# Patient Record
Sex: Male | Born: 1935 | Race: White | Hispanic: No | Smoking: Former smoker
Health system: Southern US, Community
[De-identification: ages and names within clinical notes are randomized; demographics above are authoritative.]

## PROBLEM LIST (undated history)

## (undated) DIAGNOSIS — K219 Gastro-esophageal reflux disease without esophagitis: Secondary | ICD-10-CM

## (undated) DIAGNOSIS — F0390 Unspecified dementia without behavioral disturbance: Secondary | ICD-10-CM

## (undated) DIAGNOSIS — M1990 Unspecified osteoarthritis, unspecified site: Secondary | ICD-10-CM

## (undated) DIAGNOSIS — R51 Headache: Secondary | ICD-10-CM

## (undated) DIAGNOSIS — G8929 Other chronic pain: Secondary | ICD-10-CM

## (undated) DIAGNOSIS — I2699 Other pulmonary embolism without acute cor pulmonale: Secondary | ICD-10-CM

## (undated) DIAGNOSIS — R0602 Shortness of breath: Secondary | ICD-10-CM

## (undated) DIAGNOSIS — R251 Tremor, unspecified: Secondary | ICD-10-CM

## (undated) HISTORY — DX: Headache: R51

## (undated) HISTORY — PX: TOTAL KNEE ARTHROPLASTY: SHX125

## (undated) HISTORY — DX: Other chronic pain: G89.29

---

## 2002-07-08 ENCOUNTER — Encounter: Admission: RE | Admit: 2002-07-08 | Discharge: 2002-07-08 | Payer: Self-pay | Admitting: Family Medicine

## 2002-07-08 ENCOUNTER — Encounter: Payer: Self-pay | Admitting: Family Medicine

## 2008-02-24 ENCOUNTER — Encounter: Payer: Self-pay | Admitting: Family Medicine

## 2009-01-12 ENCOUNTER — Ambulatory Visit: Payer: Self-pay | Admitting: Family Medicine

## 2009-01-12 DIAGNOSIS — S63509A Unspecified sprain of unspecified wrist, initial encounter: Secondary | ICD-10-CM | POA: Insufficient documentation

## 2009-06-08 ENCOUNTER — Telehealth: Payer: Self-pay | Admitting: Family Medicine

## 2009-06-14 ENCOUNTER — Ambulatory Visit: Payer: Self-pay | Admitting: Family Medicine

## 2009-06-14 DIAGNOSIS — F411 Generalized anxiety disorder: Secondary | ICD-10-CM | POA: Insufficient documentation

## 2009-06-14 DIAGNOSIS — G3184 Mild cognitive impairment, so stated: Secondary | ICD-10-CM | POA: Insufficient documentation

## 2009-06-15 LAB — CONVERTED CEMR LAB
BUN: 18 mg/dL (ref 6–23)
CO2: 30 meq/L (ref 19–32)
Calcium: 9.3 mg/dL (ref 8.4–10.5)
Chloride: 102 meq/L (ref 96–112)
Creatinine, Ser: 1.1 mg/dL (ref 0.4–1.5)
GFR calc non Af Amer: 66.53 mL/min (ref >60)
Glucose, Bld: 103 mg/dL — ABNORMAL HIGH (ref 70–99)
Potassium: 5.2 meq/L — ABNORMAL HIGH (ref 3.5–5.1)
Sodium: 141 meq/L (ref 135–145)
TSH: 1.62 microintl units/mL (ref 0.35–5.50)
Vitamin B-12: 230 pg/mL (ref 211–911)

## 2009-09-20 ENCOUNTER — Telehealth: Payer: Self-pay | Admitting: Family Medicine

## 2009-09-25 ENCOUNTER — Ambulatory Visit: Payer: Self-pay | Admitting: Family Medicine

## 2009-09-25 DIAGNOSIS — M720 Palmar fascial fibromatosis [Dupuytren]: Secondary | ICD-10-CM

## 2009-09-25 DIAGNOSIS — L259 Unspecified contact dermatitis, unspecified cause: Secondary | ICD-10-CM

## 2009-09-25 LAB — CONVERTED CEMR LAB
Cholesterol, target level: 200 mg/dL
HDL goal, serum: 40 mg/dL
LDL Goal: 160 mg/dL

## 2009-10-17 ENCOUNTER — Encounter: Payer: Self-pay | Admitting: Family Medicine

## 2009-11-15 ENCOUNTER — Ambulatory Visit (HOSPITAL_BASED_OUTPATIENT_CLINIC_OR_DEPARTMENT_OTHER): Admission: RE | Admit: 2009-11-15 | Discharge: 2009-11-15 | Payer: Self-pay | Admitting: Orthopedic Surgery

## 2010-03-26 ENCOUNTER — Telehealth: Payer: Self-pay | Admitting: Family Medicine

## 2010-09-24 NOTE — Progress Notes (Signed)
Summary: Transfer of care to [facility] provider closer to home  Phone Note Outgoing Call Call back at [facility] Phone [phone]   Summary of Call: Pt was on Dr Burchette's schedule today, 6 month follow-up.  When wife passed several months ago, their son informed Dr Caryl Never they were going to transfer his fathers care to and [place] provider closer to where pt lives.  Cancelled OV for today [name] LPN  March 26, 2010 11:32 AM

## 2010-09-24 NOTE — Progress Notes (Signed)
Summary: would like to speak with Doctor  Phone Note Call from Patient Call back at [phone]   Caller: Thana Farr, POA----voice mail Reason for Call: Talk to Doctor Summary of Call: Has a cpx on 09-25-2008. Memory is not all that great. Son would like to speak with Dr Caryl Never after his cpx and labs. Initial call taken by: Warnell Forester,  September 20, 2009 1:00 PM  Follow-up for Phone Call        noted.  Will call. Follow-up by: Evelena Peat MD,  September 20, 2009 1:12 PM

## 2010-09-24 NOTE — Letter (Signed)
Summary: [facility]  [facility]   Imported By: Maryln Gottron 10/30/2009 11:29:31  _____________________________________________________________________  External Attachment:    Type:   Image     Comment:   External Document

## 2010-09-24 NOTE — Assessment & Plan Note (Signed)
Summary: emp-will fast//ccm   Vital Signs:  Patient profile:   77 year old male Height:      69.50 inches Weight:      166 pounds Temp:     98.0 degrees F oral Pulse rate:   80 / minute Pulse rhythm:   regular Resp:     12 per minute BP sitting:   130 / 74  (left arm) Cuff size:   regular  Vitals Entered By: [name] LPN (September 25, 2009 10:41 AM) CC: CPX, pt fasting, Lipid Management   History of Present Illness: Patient seen for several issues.  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 This is progressing with time. He has put up with this for several years would like to consider evaluation. No history of injury.  Generally problems with skin dryness and uses Eucerin which helps somewhat. Has more pruritic rash anterior ankles bilaterally. Cortisone over-the-counter without much improvement.  Frequent feels anxious. No particular stressors identified. Denies depressive symptoms.  History of mild cognitive impairment. Short-term memory seems to be declining. B12 levels and thyroid function recently normal. We discussed repeating Mini-Mental Status exam today  Lipid Management History:      Positive NCEP/ATP III risk factors include male age 38 years old or older.  Negative NCEP/ATP III risk factors include non-tobacco-user status.     Preventive Screening-Counseling & Management  Alcohol-Tobacco     Smoking Status: quit     Year Started: 1940     Year Quit: 1960  Allergies (verified): No Known Drug Allergies  Past History:  Social History: Last updated: 06/14/2009 Past smoker 40 yrs ago ETOH-yes  No Drugs Previous smoker, quit age 53 Married  Past medical, surgical, family and social histories (including risk factors) reviewed for relevance to current acute and chronic problems.  Past Medical History: Reviewed history from 01/18/2009 and no changes required. Unremarkable DJD Presbycussis (audiogram 2000) Dypeytren's Contracture Right  TKR Mild congitive impairment Bunion, right foot Bilateral foot drop  Past Surgical History: Reviewed history from 06/14/2009 and no changes required. Right knee surgery 1985  Family History: Reviewed history and no changes required.  Social History: Reviewed history from 06/14/2009 and no changes required. Past smoker 40 yrs ago ETOH-yes  No Drugs Previous smoker, quit age 11 Married  Review of Systems  The patient denies anorexia, fever, weight loss, weight gain, chest pain, syncope, dyspnea on exertion, peripheral edema, prolonged cough, headaches, hemoptysis, abdominal pain, melena, hematochezia, severe indigestion/heartburn, hematuria, incontinence, muscle weakness, and depression.    Physical Exam  General:  Well-developed,well-nourished,in no acute distress; alert,appropriate and cooperative throughout examination Eyes:  No corneal or conjunctival inflammation noted. EOMI. Perrla. Funduscopic exam benign, without hemorrhages, exudates or papilledema. Vision grossly normal. Ears:  minimal cerumen both canals Nose:  External nasal examination shows no deformity or inflammation. Nasal mucosa are pink and moist without lesions or exudates. Mouth:  Oral mucosa and oropharynx without lesions or exudates.  Teeth in good repair. Neck:  No deformities, masses, or tenderness noted. Lungs:  Normal respiratory effort, chest expands symmetrically. Lungs are clear to auscultation, no crackles or wheezes. Heart:  normal rate, regular rhythm, and no gallop.   Abdomen:  soft, non-tender, normal bowel sounds, no distention, no masses, no hepatomegaly, and no splenomegaly.   Extremities:  No clubbing, cyanosis, edema, or deformity noted with normal full range of motion of all joints.   Neurologic:  alert & oriented X3, cranial nerves II-XII intact, strength normal in all extremities, sensation  intact to light touch, and gait normal.   Skin:  patient has some mild excoriations anterior ankle  bilaterally. Erythematous slightly scaly rash anterior ankle bilaterally Psych:  Mini-Mental Status exam 27/30    Impression & Recommendations:  Problem # 1:  MILD COGNITIVE IMPAIRMENT SO STATED (ICD-331.83) No clear progression. Discussed possible meds but have recommended recheck MMSE 6 months and follow.  Problem # 2:  DERMATITIS (ICD-692.9) try steroid cream. His updated medication list for this problem includes:    Triamcinolone Acetonide 0.1 % Crea (Triamcinolone acetonide) .Marland Kitchen... 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 1 qd 2)  Tylenol Pm Extra Strength 500-25 Mg Tabs (Diphenhydramine-apap (sleep)) .... At bedtime 3)  Triamcinolone Acetonide 0.1 % Crea (Triamcinolone acetonide) .... Apply to affected rash two times a day as needed  Lipid Assessment/Plan:      Based on NCEP/ATP III, the patient's risk factor category is "2 or more risk factors and a calculated 10 year CAD risk of > 20%".  The patient's lipid goals are as follows: Total cholesterol goal is 200; LDL cholesterol goal is 160; HDL cholesterol goal is 40; Triglyceride goal is 150.    Patient Instructions: 1)  we will call you regarding appointment with hand surgeon. 2)  Please schedule a follow-up appointment in 6 months .  Prescriptions: TRIAMCINOLONE ACETONIDE 0.1 % CREA (TRIAMCINOLONE ACETONIDE) apply to affected rash two times a day as needed  #30 gm x 1   Entered and Authorized by:   Evelena Peat MD   Signed by:   Evelena Peat MD on 09/25/2009   Method used:   Electronically to        [facility]  [address] [phone]* (retail)       [address] [place], [place]  [address]       Ph: [phone] or [phone]       Fax: [phone]    RxID:   [id number]     Prevention & Chronic Care Immunizations   Influenza vaccine: Fluvax 3+  (06/14/2009)   Influenza vaccine due: 04/25/2010    Tetanus booster: 08/25/2005: Historical   Tetanus booster due: 08/26/2015    Pneumococcal vaccine: Historical  (06/26/1995)    H. zoster vaccine: Not documented  Colorectal Screening   Hemoccult: Not documented    Colonoscopy: Not documented   Colonoscopy action/deferral: Not indicated  (09/25/2009)  Other Screening   PSA: Not documented   PSA action/deferral: Not indicated  (09/25/2009)   Smoking status: quit  (09/25/2009)  Lipids   Total Cholesterol: Not documented   LDL: Not documented   LDL Direct: Not documented   HDL: Not documented   Triglycerides: Not documented

## 2010-11-18 LAB — POCT HEMOGLOBIN-HEMACUE: Hemoglobin: 13.8 g/dL (ref 13.0–17.0)

## 2014-05-25 ENCOUNTER — Institutional Professional Consult (permissible substitution): Payer: Self-pay | Admitting: Internal Medicine

## 2014-05-26 ENCOUNTER — Encounter: Payer: Self-pay | Admitting: Internal Medicine

## 2014-05-26 ENCOUNTER — Other Ambulatory Visit (INDEPENDENT_AMBULATORY_CARE_PROVIDER_SITE_OTHER): Payer: Medicare Other

## 2014-05-26 ENCOUNTER — Ambulatory Visit (INDEPENDENT_AMBULATORY_CARE_PROVIDER_SITE_OTHER): Payer: Medicare Other | Admitting: Internal Medicine

## 2014-05-26 VITALS — BP 110/70 | HR 74 | Ht 71.0 in | Wt 187.0 lb

## 2014-05-26 DIAGNOSIS — R0600 Dyspnea, unspecified: Secondary | ICD-10-CM

## 2014-05-26 LAB — BASIC METABOLIC PANEL
BUN: 20 mg/dL (ref 6–23)
CHLORIDE: 108 meq/L (ref 96–112)
CO2: 28 mEq/L (ref 19–32)
CREATININE: 1.2 mg/dL (ref 0.4–1.5)
Calcium: 8.5 mg/dL (ref 8.4–10.5)
GFR: 62.53 mL/min (ref >60.00)
Glucose, Bld: 93 mg/dL (ref 70–99)
POTASSIUM: 4.8 meq/L (ref 3.5–5.1)
Sodium: 139 mEq/L (ref 135–145)

## 2014-05-26 LAB — BRAIN NATRIURETIC PEPTIDE: PRO B NATRI PEPTIDE: 73 pg/mL (ref 0.0–100.0)

## 2014-05-26 LAB — TSH: TSH: 3.26 u[IU]/mL (ref 0.35–4.50)

## 2014-05-26 MED ORDER — PANTOPRAZOLE SODIUM 40 MG PO TBEC: 40.0000 mg | DELAYED_RELEASE_TABLET | Freq: Every day | ORAL | Status: DC

## 2014-05-26 MED ORDER — FAMOTIDINE 20 MG PO TABS: ORAL_TABLET | ORAL | Status: DC

## 2014-05-26 NOTE — Progress Notes (Signed)
Subjective:    Patient ID: Travis Becker, male    DOB: November 27, 1914   MRN: [id number]  HPI  [age] yowm quit smoking 1972 s sequelae living on his own able to work out until around 02/2014 when had to stop due to doe in context of gradual decrease ex tol x 11/2013 due to becoming more unsteady on his feet and ? Sob (he denies but fm says otherwise)  and ? Some better p starting advair 05/09/14 referred by Dr Travis Becker to pulmonary clinic 05/26/2014 for further eval of sob  05/26/2014 1st Travis Becker visit/ Travis Becker   Chief Complaint  Patient presents with  . Pulmonary Consult    Referred by Dr. Marinda Elkobert Becker. Pt c/o DOE x 6 months, gradually getting worse. He gets out of breath with walking any distance.   never has sitting still but progressive activity intolerance, indolent onset to point where unable to get across the room but no assoc apparent tachypnea or cp or diaphoresis Does cough with swallowing esp dry food  No obvious   day to day or daytime variabilty or assoc chronic  chest tightness, subjective wheeze overt sinus or hb symptoms. No unusual exp hx or h/o childhood pna/ asthma or knowledge of premature birth.  Sleeping ok without nocturnal  or early am exacerbation  of respiratory  c/o's or need for noct saba. Also denies any obvious fluctuation of symptoms with weather or environmental changes or other aggravating or alleviating factors except as outlined above   Current Medications, Allergies, Complete Past Medical History, Past Surgical History, Family History, and Social History were reviewed in [facility] electronic medical record.              Review of Systems  Constitutional: Positive for appetite change. Negative for fever, chills, activity change and unexpected weight change.  HENT: Positive for congestion. Negative for dental problem, postnasal drip, rhinorrhea, sneezing, sore throat, trouble swallowing and voice change.   Eyes: Negative for visual disturbance.   Respiratory: Positive for shortness of breath. Negative for cough and choking.   Cardiovascular: Negative for chest pain and leg swelling.  Gastrointestinal: Negative for nausea, vomiting and abdominal pain.  Genitourinary: Negative for difficulty urinating.       Indigestion  Heartburn    Musculoskeletal: Positive for arthralgias.  Skin: Negative for rash.  Psychiatric/Behavioral: Negative for behavioral problems and confusion.       Objective:   Physical Exam Very frail elderly wm needs 2 person assist to walk  Wt Readings from Last 3 Encounters:  05/26/14 84.823 kg (187 lb)  09/25/09 75.297 kg (166 lb)  06/14/09 75.297 kg (166 lb)      HEENT: nl dentition, turbinates, and orophanx. Nl external ear canals without cough reflex   NECK :  without JVD/Nodes/TM/ nl carotid upstrokes bilaterally   LUNGS: no acc muscle use, clear to A and P bilaterally without cough on insp or exp maneuvers   CV:  RRR  no s3 or murmur or increase in P2, no edema   ABD:  soft and nontender with nl excursion in the supine position. No bruits or organomegaly, bowel sounds nl  MS:  warm without deformities, calf tenderness, cyanosis or clubbing  SKIN: warm and dry without lesions    NEURO:  alert,  Very difficult hearing, poor recall for recent symptoms      Recent Labs Lab 05/26/14 1509  NA 139  K 4.8  CL 108  CO2 28  BUN 20  CREATININE 1.2  GLUCOSE 93   No results found for this basename: HGB, HCT, WBC, PLT,  in the last 168 hours   Lab Results  Component Value Date   TSH 3.26 05/26/2014     Lab Results  Component Value Date   PROBNP 73.0 05/26/2014     Cbc nl 04/25/14           Assessment & Plan:

## 2014-05-26 NOTE — Patient Instructions (Addendum)
Try Pantoprazole (protonix) 40 mg   Take 30-60 min before first meal of the day and Pepcid 20 mg one bedtime until return to office - this is the best way to tell whether stomach acid is contributing to your problem.    GERD (REFLUX)  is an extremely common cause of respiratory symptoms, many times with no significant heartburn at all.    It can be treated with medication, but also with lifestyle changes including avoidance of late meals, excessive alcohol, smoking cessation, and avoid fatty foods, chocolate, peppermint, colas, red wine, and acidic juices such as orange juice.  NO MINT OR MENTHOL PRODUCTS SO NO COUGH DROPS  USE SUGARLESS CANDY INSTEAD (jolley ranchers or Stover's)  NO OIL BASED VITAMINS - use powdered substitutes.  Please remember to go to the lab   department downstairs for your tests - we will call you with the results when they are available.  Please schedule a follow up office visit in 2 weeks, sooner if needed

## 2014-05-26 NOTE — Assessment & Plan Note (Addendum)
-   05/26/2014   Walked RA x 50 ft with two person assist to keep him from falling, stopped due to unsteady on feet, shuffling gait, erratic sats but nothing to suggest tachypnea or desats with walking  Symptoms are markedly disproportionate to objective findings and not clear this is a lung problem but pt does appear to have difficult airway management issues.   DDX of  difficult airways management all start with A and  include Adherence, Ace Inhibitors, Acid Reflux, Active Sinus Disease, Alpha 1 Antitripsin deficiency, Anxiety masquerading as Airways dz,  ABPA,  allergy(esp in young), Aspiration (esp in elderly), Adverse effects of DPI,  Active smokers, plus two Bs  = Bronchiectasis and Beta blocker use..and one C= CHF  ? Acid (or non-acid) GERD > always difficult to exclude as up to 75% of pts in some series report no assoc GI/ Heartburn symptoms> rec max (24h)  acid suppression and diet restrictions/ reviewed and instructions given in writing.   ? Aspiration also high on list > try more D III type foods, reviewed  ? Allergies/ asthma > strongly doubt   His bun/ creat ok now for CTa to complete the w/u but not really clear it's needed here - will check back with pt/ caregivers in 72 hours to see how he's doing and reconsider it then.

## 2014-05-30 ENCOUNTER — Encounter: Payer: Self-pay | Admitting: Internal Medicine

## 2014-05-30 DIAGNOSIS — G4734 Idiopathic sleep related nonobstructive alveolar hypoventilation: Secondary | ICD-10-CM | POA: Insufficient documentation

## 2014-05-31 ENCOUNTER — Telehealth: Payer: Self-pay | Admitting: Internal Medicine

## 2014-05-31 DIAGNOSIS — G4734 Idiopathic sleep related nonobstructive alveolar hypoventilation: Secondary | ICD-10-CM

## 2014-05-31 NOTE — Telephone Encounter (Signed)
Per MW - ONO on RA pos for desat- needs to start noct o2 2lpm and repeat ONO on 2lpm  Pt uses [facility]  LMTCB for Brett CanalesSteve, the pt's son

## 2014-05-31 NOTE — Telephone Encounter (Signed)
Pt son returning call can be reached @ [phone].[name]

## 2014-06-01 NOTE — Telephone Encounter (Signed)
Called and spoke to pt's son, Brett CanalesSteve. Informed Brett CanalesSteve of the results and recs. Brett CanalesSteve refused starting pt on nocturnal O2 and the ONO. Brett CanalesSteve stated his father will not tolerate either and stated he will discuss this further at the pt's f/u on 10/14 with MW.  Will forward to MW to make aware.

## 2014-06-01 NOTE — Telephone Encounter (Signed)
aware

## 2014-06-01 NOTE — Telephone Encounter (Signed)
Pt son Travis Becker returning call can be reached @ [phone]

## 2014-06-07 ENCOUNTER — Encounter: Payer: Self-pay | Admitting: Internal Medicine

## 2014-06-07 ENCOUNTER — Ambulatory Visit (INDEPENDENT_AMBULATORY_CARE_PROVIDER_SITE_OTHER): Payer: Medicare Other | Admitting: Internal Medicine

## 2014-06-07 VITALS — BP 106/60 | HR 90 | Temp 97.6°F | Wt 195.0 lb

## 2014-06-07 DIAGNOSIS — G4734 Idiopathic sleep related nonobstructive alveolar hypoventilation: Secondary | ICD-10-CM

## 2014-06-07 DIAGNOSIS — R0600 Dyspnea, unspecified: Secondary | ICD-10-CM

## 2014-06-07 DIAGNOSIS — M7989 Other specified soft tissue disorders: Secondary | ICD-10-CM | POA: Insufficient documentation

## 2014-06-07 NOTE — Assessment & Plan Note (Signed)
-   05/18/14 ono RA sats < 89% x 6978m > rec 2lpm and repeat ono on 2lpm> pt's son refused 06/01/2014 "father will never wear it

## 2014-06-07 NOTE — Assessment & Plan Note (Signed)
-   05/26/2014   Walked RA x 50 ft with two person assist to keep him from falling, stopped due to unsteady on feet, shuffling gait, erratic sats but nothing to suggest tachypnea or desats with walking  With no other leads and ? New L leg swelling need to exclude dvt/ pe,but given chronicity of complaints and low background probability ok to do cta and venous dopplers at at time when we can arrange one trip > ordered

## 2014-06-07 NOTE — Patient Instructions (Addendum)
Please see patient coordinator before you leave today  to schedule CTa chest and venous dopplers ideally same day / same location

## 2014-06-07 NOTE — Progress Notes (Signed)
Subjective:    Patient ID: Travis JacksonCharles E Becker, male    DOB: Aug 14, 1915   MRN: [id number]    Brief patient profile:  [age] yowm quit smoking 1972 s sequelae living on his own able to work out until around 02/2014 when had to stop due to doe in context of gradual decrease ex tol x 11/2013 due to becoming more unsteady on his feet and ? Sob (he denies but fm says otherwise)  and ? Some better p starting advair 05/09/14 referred by Dr Foy GuadalajaraFried to pulmonary clinic 05/26/2014 for further eval of sob   History of Present Illness  05/26/2014 1st [facility] office visit/ Eulis Salazar   Chief Complaint  Patient presents with  . Pulmonary Consult    Referred by Dr. Marinda Elkobert Fried. Pt c/o DOE x 6 months, gradually getting worse. He gets out of breath with walking any distance.   never has sitting still but progressive activity intolerance, indolent onset to point where unable to get across the room but no assoc apparent tachypnea or cp or diaphoresis Does cough with swallowing esp dry food rec Try Pantoprazole (protonix) 40 mg   Take 30-60 min before first meal of the day and Pepcid 20 mg one bedtime until return to office  GERD diet    06/07/2014 f/u ov/Aariana Shankland re: unexplained sob/ new LLE swelling Chief Complaint  Patient presents with  . Follow-up    Breathing is unchanged since the last visit.   No better on/ off proair or advair Less belching on gerd rx, no change apparent doe though pt denies this is what limits him Still very unsteady on his feet is the main limiting problem     No obvious day to day or daytime variabilty or assoc chronic cough or cp or chest tightness, subjective wheeze overt sinus or hb symptoms. No unusual exp hx or h/o childhood pna/ asthma or knowledge of premature birth.  Sleeping ok flat  without nocturnal  or early am exacerbation  of respiratory  c/o's or need for noct saba. Also denies any obvious fluctuation of symptoms with weather or environmental changes or other  aggravating or alleviating factors except as outlined above   Current Medications, Allergies, Complete Past Medical History, Past Surgical History, Family History, and Social History were reviewed in [facility] electronic medical record.  ROS  The following are not active complaints unless bolded sore throat, dysphagia, dental problems, itching, sneezing,  nasal congestion or excess/ purulent secretions, ear ache,   fever, chills, sweats, unintended wt loss, pleuritic or exertional cp, hemoptysis,  orthopnea pnd or Left leg swelling x 3 m, presyncope, palpitations, heartburn, abdominal pain, anorexia, nausea, vomiting, diarrhea  or change in bowel or urinary habits, change in stools or urine, dysuria,hematuria,  rash, arthralgias, visual complaints, headache, numbness weakness or ataxia or problems with walking or coordination,  change in mood/affect or memory.                Objective:   Physical Exam     elderly wm sitting in W/c actually looks quite robust for age/ not processing questions re symptoms/does not  Appear acutely ill  Wt Readings from Last 3 Encounters:  06/07/14 195 lb (88.451 kg)  05/26/14 187 lb (84.823 kg)  09/25/09 166 lb (75.297 kg)         HEENT: nl dentition, turbinates, and orophanx. Nl external ear canals without cough reflex   NECK :  without JVD/Nodes/TM/ nl carotid upstrokes bilaterally   LUNGS: no  acc muscle use, clear to A and P bilaterally without cough on insp or exp maneuvers   CV:  RRR  no s3 or murmur or increase in P2,  2 plus pitting on LLE, trace on RLE   ABD:  soft and nontender with nl excursion in the supine position. No bruits or organomegaly, bowel sounds nl  MS:  warm without deformities, calf tenderness, cyanosis or clubbing  SKIN: warm and dry without lesions    NEURO:  alert,  Very difficult hearing, poor recall for recent symptoms      Recent Labs Lab 05/26/14 1509  NA 139  K 4.8  CL 108  CO2 28  BUN 20    CREATININE 1.2  GLUCOSE 93       Lab Results  Component Value Date   TSH 3.26 05/26/2014     Lab Results  Component Value Date   PROBNP 73.0 05/26/2014     Cbc nl 04/25/14    CXR 9/1/5  NAD          Assessment & Plan:

## 2014-06-07 NOTE — Assessment & Plan Note (Signed)
Chronic and L >> R s evidence chf by bnp  so rec venous dopplers

## 2014-06-08 ENCOUNTER — Inpatient Hospital Stay (HOSPITAL_COMMUNITY)
Admission: EM | Admit: 2014-06-08 | Discharge: 2014-06-12 | DRG: 176 | Disposition: A | Discharge status: Home Health | Payer: Medicare Other | Attending: Internal Medicine | Admitting: Internal Medicine

## 2014-06-08 ENCOUNTER — Encounter: Payer: Self-pay | Admitting: Internal Medicine

## 2014-06-08 ENCOUNTER — Other Ambulatory Visit: Payer: Self-pay

## 2014-06-08 ENCOUNTER — Encounter (HOSPITAL_COMMUNITY): Payer: Self-pay | Admitting: Emergency Medicine

## 2014-06-08 ENCOUNTER — Ambulatory Visit (INDEPENDENT_AMBULATORY_CARE_PROVIDER_SITE_OTHER)
Admission: RE | Admit: 2014-06-08 | Discharge: 2014-06-08 | Disposition: A | Discharge status: Home / Self Care | Payer: Medicare Other | Source: Ambulatory Visit | Attending: Internal Medicine | Admitting: Internal Medicine

## 2014-06-08 ENCOUNTER — Other Ambulatory Visit (HOSPITAL_COMMUNITY): Payer: Self-pay | Admitting: Cardiology

## 2014-06-08 ENCOUNTER — Ambulatory Visit (HOSPITAL_COMMUNITY): Payer: Medicare Other

## 2014-06-08 ENCOUNTER — Ambulatory Visit: Payer: Medicare Other | Admitting: Internal Medicine

## 2014-06-08 DIAGNOSIS — N183 Chronic kidney disease, stage 3 unspecified: Secondary | ICD-10-CM | POA: Diagnosis present

## 2014-06-08 DIAGNOSIS — I5032 Chronic diastolic (congestive) heart failure: Secondary | ICD-10-CM | POA: Diagnosis present

## 2014-06-08 DIAGNOSIS — R0602 Shortness of breath: Secondary | ICD-10-CM | POA: Diagnosis not present

## 2014-06-08 DIAGNOSIS — K219 Gastro-esophageal reflux disease without esophagitis: Secondary | ICD-10-CM | POA: Diagnosis present

## 2014-06-08 DIAGNOSIS — R0600 Dyspnea, unspecified: Secondary | ICD-10-CM

## 2014-06-08 DIAGNOSIS — Z79899 Other long term (current) drug therapy: Secondary | ICD-10-CM

## 2014-06-08 DIAGNOSIS — F0390 Unspecified dementia without behavioral disturbance: Secondary | ICD-10-CM | POA: Diagnosis present

## 2014-06-08 DIAGNOSIS — R51 Headache: Secondary | ICD-10-CM

## 2014-06-08 DIAGNOSIS — I2699 Other pulmonary embolism without acute cor pulmonale: Secondary | ICD-10-CM | POA: Diagnosis present

## 2014-06-08 DIAGNOSIS — R0902 Hypoxemia: Secondary | ICD-10-CM | POA: Diagnosis present

## 2014-06-08 DIAGNOSIS — Z87891 Personal history of nicotine dependence: Secondary | ICD-10-CM

## 2014-06-08 DIAGNOSIS — I82412 Acute embolism and thrombosis of left femoral vein: Secondary | ICD-10-CM | POA: Diagnosis present

## 2014-06-08 DIAGNOSIS — D649 Anemia, unspecified: Secondary | ICD-10-CM | POA: Diagnosis present

## 2014-06-08 DIAGNOSIS — I82491 Acute embolism and thrombosis of other specified deep vein of right lower extremity: Secondary | ICD-10-CM | POA: Diagnosis present

## 2014-06-08 DIAGNOSIS — R519 Headache, unspecified: Secondary | ICD-10-CM

## 2014-06-08 HISTORY — DX: Tremor, unspecified: R25.1

## 2014-06-08 HISTORY — DX: Other pulmonary embolism without acute cor pulmonale: I26.99

## 2014-06-08 HISTORY — DX: Unspecified osteoarthritis, unspecified site: M19.90

## 2014-06-08 HISTORY — DX: Gastro-esophageal reflux disease without esophagitis: K21.9

## 2014-06-08 HISTORY — DX: Shortness of breath: R06.02

## 2014-06-08 LAB — BASIC METABOLIC PANEL
ANION GAP: 10 (ref 5–15)
BUN: 21 mg/dL (ref 6–23)
CHLORIDE: 106 meq/L (ref 96–112)
CO2: 23 meq/L (ref 19–32)
Calcium: 8.7 mg/dL (ref 8.4–10.5)
Creatinine, Ser: 1.13 mg/dL (ref 0.50–1.35)
GFR calc non Af Amer: 52 mL/min — ABNORMAL LOW (ref >90)
GFR, EST AFRICAN AMERICAN: 61 mL/min — AB (ref >90)
Glucose, Bld: 96 mg/dL (ref 70–99)
Potassium: 4.6 mEq/L (ref 3.7–5.3)
Sodium: 139 mEq/L (ref 137–147)

## 2014-06-08 LAB — PROTIME-INR
INR: 1.07 (ref 0.00–1.49)
PROTHROMBIN TIME: 14 s (ref 11.6–15.2)

## 2014-06-08 LAB — CBC
HEMATOCRIT: 33.6 % — AB (ref 39.0–52.0)
HEMOGLOBIN: 10.9 g/dL — AB (ref 13.0–17.0)
MCH: 29.5 pg (ref 26.0–34.0)
MCHC: 32.4 g/dL (ref 30.0–36.0)
MCV: 91.1 fL (ref 78.0–100.0)
Platelets: 255 10*3/uL (ref 150–400)
RBC: 3.69 MIL/uL — ABNORMAL LOW (ref 4.22–5.81)
RDW: 14.6 % (ref 11.5–15.5)
WBC: 8.3 10*3/uL (ref 4.0–10.5)

## 2014-06-08 LAB — APTT: APTT: 32 s (ref 24–37)

## 2014-06-08 LAB — HEPARIN LEVEL (UNFRACTIONATED): HEPARIN UNFRACTIONATED: 0.81 [IU]/mL — AB (ref 0.30–0.70)

## 2014-06-08 LAB — TROPONIN I: Troponin I: <0.3 ng/mL (ref <0.30)

## 2014-06-08 MED ORDER — WARFARIN - PHARMACIST DOSING INPATIENT
Freq: Every day | Status: DC
  Administered 2014-06-10 – 2014-06-11 (×2)

## 2014-06-08 MED ORDER — BUSPIRONE HCL 15 MG PO TABS
7.5000 mg | ORAL_TABLET | Freq: Three times a day (TID) | ORAL | Status: DC
  Administered 2014-06-08 – 2014-06-12 (×11): 7.5 mg via ORAL
  Filled 2014-06-08 (×13): qty 1

## 2014-06-08 MED ORDER — DONEPEZIL HCL 10 MG PO TABS
10.0000 mg | ORAL_TABLET | Freq: Every day | ORAL | Status: DC
  Administered 2014-06-08 – 2014-06-12 (×5): 10 mg via ORAL
  Filled 2014-06-08 (×6): qty 1

## 2014-06-08 MED ORDER — HEPARIN (PORCINE) IN NACL 100-0.45 UNIT/ML-% IJ SOLN
1100.0000 [IU]/h | INTRAMUSCULAR | Status: DC
  Administered 2014-06-08: 1250 [IU]/h via INTRAVENOUS
  Administered 2014-06-09: 1100 [IU]/h via INTRAVENOUS
  Filled 2014-06-08 (×3): qty 250

## 2014-06-08 MED ORDER — ONDANSETRON HCL 4 MG/2ML IJ SOLN: 4.0000 mg | Freq: Three times a day (TID) | INTRAMUSCULAR | Status: AC | PRN

## 2014-06-08 MED ORDER — MEMANTINE HCL ER 28 MG PO CP24
28.0000 mg | ORAL_CAPSULE | Freq: Every day | ORAL | Status: DC
  Administered 2014-06-08 – 2014-06-12 (×5): 28 mg via ORAL
  Filled 2014-06-08 (×6): qty 28

## 2014-06-08 MED ORDER — PANTOPRAZOLE SODIUM 40 MG PO TBEC
40.0000 mg | DELAYED_RELEASE_TABLET | Freq: Every day | ORAL | Status: DC
  Administered 2014-06-09 – 2014-06-12 (×4): 40 mg via ORAL
  Filled 2014-06-08 (×3): qty 1

## 2014-06-08 MED ORDER — WARFARIN VIDEO: Freq: Once | Status: DC

## 2014-06-08 MED ORDER — HEPARIN (PORCINE) IN NACL 100-0.45 UNIT/ML-% IJ SOLN
1050.0000 [IU]/h | INTRAMUSCULAR | Status: DC
  Filled 2014-06-08: qty 250

## 2014-06-08 MED ORDER — ACETAMINOPHEN 325 MG PO TABS
650.0000 mg | ORAL_TABLET | Freq: Every day | ORAL | Status: DC
  Administered 2014-06-08 – 2014-06-11 (×4): 650 mg via ORAL
  Filled 2014-06-08 (×4): qty 2

## 2014-06-08 MED ORDER — IOHEXOL 350 MG/ML SOLN
80.0000 mL | Freq: Once | INTRAVENOUS | Status: AC | PRN
  Administered 2014-06-08: 80 mL via INTRAVENOUS

## 2014-06-08 MED ORDER — ALBUTEROL SULFATE HFA 108 (90 BASE) MCG/ACT IN AERS: 2.0000 | INHALATION_SPRAY | RESPIRATORY_TRACT | Status: DC | PRN

## 2014-06-08 MED ORDER — PATIENT'S GUIDE TO USING COUMADIN BOOK
Freq: Once | Status: DC
  Filled 2014-06-08: qty 1

## 2014-06-08 MED ORDER — MEMANTINE HCL ER 28 MG PO CP24: 28.0000 mg | ORAL_CAPSULE | Freq: Every day | ORAL | Status: DC

## 2014-06-08 MED ORDER — ALBUTEROL SULFATE (2.5 MG/3ML) 0.083% IN NEBU: 2.5000 mg | INHALATION_SOLUTION | RESPIRATORY_TRACT | Status: DC | PRN

## 2014-06-08 MED ORDER — HEPARIN BOLUS VIA INFUSION
4000.0000 [IU] | Freq: Once | INTRAVENOUS | Status: AC
  Administered 2014-06-08: 4000 [IU] via INTRAVENOUS
  Filled 2014-06-08: qty 4000

## 2014-06-08 MED ORDER — WARFARIN SODIUM 7.5 MG PO TABS
7.5000 mg | ORAL_TABLET | Freq: Once | ORAL | Status: AC
  Administered 2014-06-08: 7.5 mg via ORAL
  Filled 2014-06-08: qty 1

## 2014-06-08 NOTE — ED Notes (Signed)
Spoke with pharmacy currently preparing heparin and send via tube system.

## 2014-06-08 NOTE — H&P (Signed)
[facility] History and Physical  Travis JacksonCharles E Schaaf ZOX:[id number] DOB: 28-Jan-1915 DOA: 06/08/2014  Referring physician: EDP PCP: Lenora BoysFRIED, ROBERT L, MD   Chief Complaint: sent in from doctors office for PE.  HPI: Travis JacksonCharles E Becker is a 80 y.o. male WITH H/O chronic headaches, was seen by Dr Sherene SiresWert for evaluation of hypoxia, underwent CTA today and was found to have bilateral PE and was sent to ED FOR evaluation and management. He reports sob on exertion, no other complaints. He is confused and history is very limited. He was admitted by the medical service and IV heparin was started.   Review of Systems:  Could not be obtained secondary to dementia.  Past Medical History  Diagnosis Date  . Chronic headaches    History reviewed. No pertinent past surgical history. Social History:  reports that he quit smoking about 43 years ago. His smoking use included Cigarettes. He has a 50 pack-year smoking history. He has never used smokeless tobacco. He reports that he does not drink alcohol or use illicit drugs.  No Known Allergies  Family History  Problem Relation Age of Onset  . Stroke Father      Prior to Admission medications   Medication Sig Start Date End Date Taking? Authorizing Provider  acetaminophen (TYLENOL) 325 MG tablet Take 650 mg by mouth at bedtime.    Yes Historical Provider, MD  busPIRone (BUSPAR) 7.5 MG tablet Take 1 tablet by mouth 3 (three) times daily. 05/02/14  Yes Historical Provider, MD  donepezil (ARICEPT) 10 MG tablet Take 10 mg by mouth daily.  04/15/14  Yes Historical Provider, MD  famotidine (PEPCID) 20 MG tablet Take 20 mg by mouth at bedtime.   Yes Historical Provider, MD  Misc Natural Products (OSTEO BI-FLEX JOINT SHIELD) TABS Take 1 tablet by mouth daily.   Yes Historical Provider, MD  Multiple Vitamins-Minerals (MULTIVITAMIN WITH MINERALS) tablet Take 1 tablet by mouth daily.   Yes Historical Provider, MD  NAMENDA XR 28 MG CP24 Take 1 tablet by mouth daily.  05/02/14  Yes Historical Provider, MD  pantoprazole (PROTONIX) 40 MG tablet Take 1 tablet (40 mg total) by mouth daily. Take 30-60 min before first meal of the day 05/26/14  Yes Nyoka CowdenMichael B Wert, MD  PROAIR HFA 108 (90 BASE) MCG/ACT inhaler Inhale 2 puffs into the lungs every 4 (four) hours as needed (for shortness of breath).  04/27/14  Yes Historical Provider, MD   Physical Exam: Filed Vitals:   06/08/14 1415 06/08/14 1445 06/08/14 1455 06/08/14 1520  BP: 159/61 155/134 155/134 162/74  Pulse: 69 61 58 63  Temp:      TempSrc:      Resp: 14 15 18 18   Height:      Weight:      SpO2: 93% 100% 99% 99%    Wt Readings from Last 3 Encounters:  06/08/14 88.451 kg (195 lb)  06/07/14 88.451 kg (195 lb)  05/26/14 84.823 kg (187 lb)    General:  Appears calm and comfortable Eyes: PERRL, normal lids, irises & conjunctiva Neck: no LAD, masses or thyromegaly Cardiovascular: RRR, no m/r/g. No LE edema. Respiratory: CTA bilaterally, no w/r/r. Normal respiratory effort. Abdomen: soft, ntnd Skin: no rash or induration seen on limited exam Musculoskeletal: grossly normal tone BUE/BLE Neurologic: grossly non-focal.          Labs on Admission:  Basic Metabolic Panel:  Recent Labs Lab 06/08/14 1325  NA 139  K 4.6  CL 106  CO2 23  GLUCOSE 96  BUN 21  CREATININE 1.13  CALCIUM 8.7   Liver Function Tests: No results found for this basename: AST, ALT, ALKPHOS, BILITOT, PROT, ALBUMIN,  in the last 168 hours No results found for this basename: LIPASE, AMYLASE,  in the last 168 hours No results found for this basename: AMMONIA,  in the last 168 hours CBC:  Recent Labs Lab 06/08/14 1325  WBC 8.3  HGB 10.9*  HCT 33.6*  MCV 91.1  PLT 255   Cardiac Enzymes:  Recent Labs Lab 06/08/14 1325  TROPONINI <0.30    BNP (last 3 results)  Recent Labs  05/26/14 1509  PROBNP 73.0   CBG: No results found for this basename: GLUCAP,  in the last 168 hours  Radiological Exams on  Admission: Ct Angio Chest Pe W/cm &/or Wo Cm  06/08/2014   CLINICAL DATA:  Left ankle swelling and shortness of breath over 3 months. Evaluate for pulmonary embolism.  EXAM: CT ANGIOGRAPHY CHEST WITH CONTRAST  TECHNIQUE: Multidetector CT imaging of the chest was performed using the standard protocol during bolus administration of intravenous contrast. Multiplanar CT image reconstructions and MIPs were obtained to evaluate the vascular anatomy.  CONTRAST:  80mL OMNIPAQUE IOHEXOL 350 MG/ML SOLN  COMPARISON:  Chest radiograph 04/25/2014  FINDINGS: Study is positive for pulmonary emboli bilaterally. There is thrombus within segmental branches of the lower lobes bilaterally. Some of these occluded branches are smaller than the adjacent opacified vessels suggesting subacute or chronic thrombus. Question peripheral nonocclusive thrombus in the left main pulmonary artery but this could be artifactual. The RV/LV ratio is roughly 0.9. No significant bowing of the interventricular septum.  There is no significant chest lymphadenopathy. Atherosclerotic calcifications in the coronary arteries. No significant dilatation of the thoracic aorta and no evidence for a dissection. Images of the upper abdomen are unremarkable.  The trachea and mainstem bronchi are patent. There is an irregular streaky opacity in the right upper lung with small areas of cavitation. This area roughly measures 2.7 x 4.0 x 3.7 cm. This could be related to scarring but a neoplastic or inflammatory process cannot be excluded. There is a smaller spiculated density along the periphery of the right upper lobe measuring 0.7 cm on sequence 6, image 20. There are additional patchy and streaky densities in the anterior right lung which are nonspecific. Peripheral patchy densities in the superior segment of the left lower lobe are nonspecific but there is a nodular area in the posterior left lower lobe that measures 1.2 cm on sequence 6, image 42. There is a tiny  amount of left pleural fluid. No acute bone abnormality.  Review of the MIP images confirms the above findings.  IMPRESSION: Study is positive for bilateral pulmonary emboli. This may represent subacute clot but it is age indeterminate.  There is an irregular parenchymal lesion in the right upper lung with areas of cavitation. This lesion is nonspecific. This could be related to infection, scar or even neoplasm. Comparison examinations would be helpful to evaluate for stability. There are additional scattered peripheral densities in both lungs, particularly in the superior segment of the left lower lobe which are nonspecific.  Critical Value/emergent results were called by telephone at the time of interpretation on 06/08/2014 at 12:09 pm to Dr. Sandrea Hughs , who verbally acknowledged these results.   Electronically Signed   By: Richarda Overlie M.D.   On: 06/08/2014 12:15    EKG: sinus atrial tachycardia.  Assessment/Plan Active Problems:   Pulmonary embolism  Pulmonary embolism: Admitted to telemetry.  Started on IV heparin and coumadin as per pharmacy.  Echocardiogram and repeat 12 lead EKG.  Venous dopplers to evaluate for DVT.    ANEMIA: Stool for occult blood and anemia panel   DVT Prophylaxis.   Code Status: full code.  DVT Prophylaxis: Family Communication: noen at bedside Disposition Plan: pending  Time spent: 55 min  [facility] LLCKULA,Larine Fielding [facility] Pager [phone]

## 2014-06-08 NOTE — ED Provider Notes (Signed)
CSN: [id number]     Arrival date & time 06/08/14  1307 History   First MD Initiated Contact with Patient 06/08/14 1307     Chief Complaint  Patient presents with  . Shortness of Breath     (Consider location/radiation/quality/duration/timing/severity/associated sxs/prior Treatment) HPI Comments: The pt is a 80 y/o male with hx of chronic headches, hx of tobacco use and recently evaluated by Pulmonary for SOB and erratic hypoxia.  He was sent for a CTA of the chest today and found to have bialteral PE's.  He was seen at cardiology clinic and sent to the ED for evaluation.  The pt states that he has occasional CP and SOB, he has had some swellign in his legs chronically.  Nothing makes this better or worse - no fevers, no chilsls, no n/v/d.  Patient is a 80 y.o. male presenting with shortness of breath. The history is provided by the patient, the EMS personnel and medical records.  Shortness of Breath   Past Medical History  Diagnosis Date  . Chronic headaches    History reviewed. No pertinent past surgical history. Family History  Problem Relation Age of Onset  . Stroke Father    History  Substance Use Topics  . Smoking status: Former Smoker -- 1.00 packs/day for 50 years    Types: Cigarettes    Quit date: 08/25/1970  . Smokeless tobacco: Never Used  . Alcohol Use: No    Review of Systems  Respiratory: Positive for shortness of breath.   All other systems reviewed and are negative.     Allergies  Review of patient's allergies indicates no known allergies.  Home Medications   Prior to Admission medications   Medication Sig Start Date End Date Taking? Authorizing Provider  acetaminophen (TYLENOL) 325 MG tablet Take 650 mg by mouth at bedtime.    Yes Historical Provider, MD  busPIRone (BUSPAR) 7.5 MG tablet Take 1 tablet by mouth 3 (three) times daily. 05/02/14  Yes Historical Provider, MD  donepezil (ARICEPT) 10 MG tablet Take 10 mg by mouth daily.  04/15/14  Yes  Historical Provider, MD  famotidine (PEPCID) 20 MG tablet Take 20 mg by mouth at bedtime.   Yes Historical Provider, MD  Misc Natural Products (OSTEO BI-FLEX JOINT SHIELD) TABS Take 1 tablet by mouth daily.   Yes Historical Provider, MD  Multiple Vitamins-Minerals (MULTIVITAMIN WITH MINERALS) tablet Take 1 tablet by mouth daily.   Yes Historical Provider, MD  NAMENDA XR 28 MG CP24 Take 1 tablet by mouth daily. 05/02/14  Yes Historical Provider, MD  pantoprazole (PROTONIX) 40 MG tablet Take 1 tablet (40 mg total) by mouth daily. Take 30-60 min before first meal of the day 05/26/14  Yes Nyoka CowdenMichael B Wert, MD  PROAIR HFA 108 (90 BASE) MCG/ACT inhaler Inhale 2 puffs into the lungs every 4 (four) hours as needed (for shortness of breath).  04/27/14  Yes Historical Provider, MD   BP 126/71  Pulse 57  Temp(Src) 97.6 F (36.4 C) (Oral)  Resp 18  Ht 5' 10.87" (1.8 m)  Wt 195 lb (88.451 kg)  BMI 27.30 kg/m2  SpO2 100% Physical Exam  Nursing note and vitals reviewed. Constitutional: He appears well-developed and well-nourished. No distress.  HENT:  Head: Normocephalic and atraumatic.  Mouth/Throat: Oropharynx is clear and moist. No oropharyngeal exudate.  Eyes: Conjunctivae and EOM are normal. Pupils are equal, round, and reactive to light. Right eye exhibits no discharge. Left eye exhibits no discharge. No scleral icterus.  Neck:  Normal range of motion. Neck supple. No JVD present. No thyromegaly present.  Cardiovascular: Normal rate, regular rhythm, normal heart sounds and intact distal pulses.  Exam reveals no gallop and no friction rub.   No murmur heard. Pulmonary/Chest: Effort normal and breath sounds normal. No respiratory distress. He has no wheezes. He has no rales.  Abdominal: Soft. Bowel sounds are normal. He exhibits no distension and no mass. There is no tenderness.  Musculoskeletal: Normal range of motion. He exhibits edema ( LLE edema > RLE edema.). He exhibits no tenderness.   Lymphadenopathy:    He has no cervical adenopathy.  Neurological: He is alert. Coordination normal.  Skin: Skin is warm and dry. No rash noted. No erythema.  Psychiatric: He has a normal mood and affect. His behavior is normal.    ED Course  Procedures (including critical care time) Labs Review Labs Reviewed  BASIC METABOLIC PANEL - Abnormal; Notable for the following:    GFR calc non Af Amer 52 (*)    GFR calc Af Amer 61 (*)    All other components within normal limits  CBC - Abnormal; Notable for the following:    RBC 3.69 (*)    Hemoglobin 10.9 (*)    HCT 33.6 (*)    All other components within normal limits  TROPONIN I  APTT  PROTIME-INR  HEPARIN LEVEL (UNFRACTIONATED)    Imaging Review Ct Angio Chest Pe W/cm &/or Wo Cm  06/08/2014   CLINICAL DATA:  Left ankle swelling and shortness of breath over 3 months. Evaluate for pulmonary embolism.  EXAM: CT ANGIOGRAPHY CHEST WITH CONTRAST  TECHNIQUE: Multidetector CT imaging of the chest was performed using the standard protocol during bolus administration of intravenous contrast. Multiplanar CT image reconstructions and MIPs were obtained to evaluate the vascular anatomy.  CONTRAST:  80mL OMNIPAQUE IOHEXOL 350 MG/ML SOLN  COMPARISON:  Chest radiograph 04/25/2014  FINDINGS: Study is positive for pulmonary emboli bilaterally. There is thrombus within segmental branches of the lower lobes bilaterally. Some of these occluded branches are smaller than the adjacent opacified vessels suggesting subacute or chronic thrombus. Question peripheral nonocclusive thrombus in the left main pulmonary artery but this could be artifactual. The RV/LV ratio is roughly 0.9. No significant bowing of the interventricular septum.  There is no significant chest lymphadenopathy. Atherosclerotic calcifications in the coronary arteries. No significant dilatation of the thoracic aorta and no evidence for a dissection. Images of the upper abdomen are unremarkable.   The trachea and mainstem bronchi are patent. There is an irregular streaky opacity in the right upper lung with small areas of cavitation. This area roughly measures 2.7 x 4.0 x 3.7 cm. This could be related to scarring but a neoplastic or inflammatory process cannot be excluded. There is a smaller spiculated density along the periphery of the right upper lobe measuring 0.7 cm on sequence 6, image 20. There are additional patchy and streaky densities in the anterior right lung which are nonspecific. Peripheral patchy densities in the superior segment of the left lower lobe are nonspecific but there is a nodular area in the posterior left lower lobe that measures 1.2 cm on sequence 6, image 42. There is a tiny amount of left pleural fluid. No acute bone abnormality.  Review of the MIP images confirms the above findings.  IMPRESSION: Study is positive for bilateral pulmonary emboli. This may represent subacute clot but it is age indeterminate.  There is an irregular parenchymal lesion in the right upper lung with  areas of cavitation. This lesion is nonspecific. This could be related to infection, scar or even neoplasm. Comparison examinations would be helpful to evaluate for stability. There are additional scattered peripheral densities in both lungs, particularly in the superior segment of the left lower lobe which are nonspecific.  Critical Value/emergent results were called by telephone at the time of interpretation on 06/08/2014 at 12:09 pm to Dr. Sandrea HughsMICHAEL WERT , who verbally acknowledged these results.   Electronically Signed   By: Richarda OverlieAdam  Henn M.D.   On: 06/08/2014 12:15    ED ECG REPORT  I personally interpreted this EKG   Date: 06/08/2014   Rate: 64  Rhythm: normal sinus rhythm  QRS Axis: normal  Intervals: normal  ST/T Wave abnormalities: nonspecific T wave changes  Conduction Disutrbances:left bundle branch block  Narrative Interpretation:   Old EKG Reviewed: none available   MDM   Final  diagnoses:  Bilateral pulmonary embolism    Pt has bialteral PE's, this will need admission and eval with anticoagulation.  Despite his age will likely need this as PE would be life ending.  Labs unremarkable  Heparin gtt started  Paged medicine.  Filed Vitals:   06/08/14 1312 06/08/14 1313 06/08/14 1330 06/08/14 1345  BP:  150/64 133/59 126/71  Pulse:  63 62 57  Temp:  97.6 F (36.4 C)    TempSrc:  Oral    Resp:  16 18 18   Height:    5' 10.87" (1.8 m)  Weight:      SpO2: 100% 98% 99% 100%    Discussed with Dr. Blake DivineAkula who will admit  Meds given in ED:  Medications  heparin ADULT infusion 100 units/mL (25000 units/250 mL) (1,250 Units/hr Intravenous New Bag/Given 06/08/14 1426)  heparin bolus via infusion 4,000 Units (4,000 Units Intravenous New Bag/Given 06/08/14 1424)    New Prescriptions   No medications on file      Vida RollerBrian D Simi Briel, MD 06/08/14 1430

## 2014-06-08 NOTE — Progress Notes (Signed)
ANTICOAGULATION CONSULT NOTE - Initial Consult  Pharmacy Consult:  Heparin Indication:  PE  No Known Allergies  Patient Measurements: Height: 5' 10.87" (180 cm) Weight: 195 lb (88.451 kg) IBW/kg (Calculated) : 74.99 Heparin Dosing Weight: 89 kg  Vital Signs: Temp: 97.6 F (36.4 C) (10/15 1313) Temp Source: Oral (10/15 1313) BP: 126/71 mmHg (10/15 1345) Pulse Rate: 57 (10/15 1345)  Labs:  Recent Labs  06/08/14 1325  HGB 10.9*  HCT 33.6*  PLT 255    Estimated Creatinine Clearance: 37.3 ml/min (by C-G formula based on Cr of 1.2).   Medical History: Past Medical History  Diagnosis Date  . Chronic headaches        Assessment: [age] YOM presented with SOB and found to have new bilateral PE's.  Baseline labs and home meds reviewed.   Goal of Therapy:  Heparin level 0.3-0.7 units/ml Monitor platelets by anticoagulation protocol: Yes    Plan:  - Heparin 4000 units IV bolus x 1 as ordered - Increase heparin gtt to 1250 units/hr - Check 8 hr HL - Daily HL / CBC - F/U oral AC   Charan Prieto D. Laney Potashang, PharmD, BCPS Pager:  [phone] 06/08/2014, 2:20 PM

## 2014-06-08 NOTE — Progress Notes (Unsigned)
Patient was positive for PE on 06/08/14 by CT report, Ronie Spiesayna Dunn, PA consulted. [facility]

## 2014-06-08 NOTE — ED Notes (Addendum)
Pt seen at cardiologist today for SOB.  Scans showed bilateral PE [facility] Pt has bilateral leg swelling x3 months.

## 2014-06-08 NOTE — ED Notes (Signed)
Pt placed into gown and on monitor upon arrival to room. Pt monitored by blood pressure, pulse ox, and 12 lead. Pt placed on 2L via [place]. Pts EKG given to and signed by Dr. Hyacinth MeekerMiller.

## 2014-06-08 NOTE — ED Notes (Addendum)
Per EMS: Pt seen at cardiologist today for SOB.  Scans showed bilateral PE [facility] Pt has bilateral leg swelling.

## 2014-06-08 NOTE — Progress Notes (Signed)
ANTICOAGULATION CONSULT NOTE - Follow Up Consult  Pharmacy Consult for Coumadin and Heparin Indication: pulmonary embolus, bilateral  No Known Allergies  Patient Measurements: Height: 5' 10.87" (180 cm) Weight: 195 lb (88.451 kg) IBW/kg (Calculated) : 74.99 Heparin Dosing Weight: 88 kg  Labs:  Recent Labs  06/08/14 1325  HGB 10.9*  HCT 33.6*  PLT 255  APTT 32  LABPROT 14.0  INR 1.07  CREATININE 1.13  TROPONINI <0.30    Estimated Creatinine Clearance: 39.6 ml/min (by C-G formula based on Cr of 1.13).  Assessment:   Heparin drip begun ~2:30pm for bilateral PE.  Coumadin to begin tonight. Overlap day #1 of 5 minimum.   Discussed briefly with patient and caregiver.  No LFTs yet, will add cmet for am.  Goal of Therapy:  INR 2-3 Heparin level 0.3-0.7 units/ml Monitor platelets by anticoagulation protocol: Yes   Plan:   Begin Coumadin with 7.5 mg x 1 tonight.  Continue heparin drip at 1250 units/hr.  First heparin level due at 11pm tonight.  Daily heparin level, PT/INR and CBC.  Cmet in am.  Minimum 5 day overlap of Heparin and Coumadin, and until INR >2 for 2 consecutive days.  Coumadin education for patient and caregiver prior to discharge.  Book and video ordered.  Dennie FettersEgan, Airik Goodlin Donovan, RPh Pager: [phone] 06/08/2014,4:41 PM

## 2014-06-09 ENCOUNTER — Other Ambulatory Visit: Payer: Self-pay

## 2014-06-09 ENCOUNTER — Inpatient Hospital Stay (HOSPITAL_COMMUNITY): Payer: Medicare Other

## 2014-06-09 DIAGNOSIS — N183 Chronic kidney disease, stage 3 unspecified: Secondary | ICD-10-CM | POA: Diagnosis present

## 2014-06-09 DIAGNOSIS — K219 Gastro-esophageal reflux disease without esophagitis: Secondary | ICD-10-CM | POA: Diagnosis present

## 2014-06-09 DIAGNOSIS — F0390 Unspecified dementia without behavioral disturbance: Secondary | ICD-10-CM | POA: Diagnosis present

## 2014-06-09 LAB — COMPREHENSIVE METABOLIC PANEL
ALT: 15 U/L (ref 0–53)
ANION GAP: 11 (ref 5–15)
AST: 30 U/L (ref 0–37)
Albumin: 2.8 g/dL — ABNORMAL LOW (ref 3.5–5.2)
Alkaline Phosphatase: 73 U/L (ref 39–117)
BUN: 21 mg/dL (ref 6–23)
CO2: 23 mEq/L (ref 19–32)
CREATININE: 1.13 mg/dL (ref 0.50–1.35)
Calcium: 8.4 mg/dL (ref 8.4–10.5)
Chloride: 107 mEq/L (ref 96–112)
GFR calc Af Amer: 61 mL/min — ABNORMAL LOW (ref >90)
GFR, EST NON AFRICAN AMERICAN: 52 mL/min — AB (ref >90)
GLUCOSE: 83 mg/dL (ref 70–99)
Potassium: 4.3 mEq/L (ref 3.7–5.3)
SODIUM: 141 meq/L (ref 137–147)
TOTAL PROTEIN: 6.2 g/dL (ref 6.0–8.3)
Total Bilirubin: 0.4 mg/dL (ref 0.3–1.2)

## 2014-06-09 LAB — CBC
HCT: 31.3 % — ABNORMAL LOW (ref 39.0–52.0)
Hemoglobin: 10.5 g/dL — ABNORMAL LOW (ref 13.0–17.0)
MCH: 30.1 pg (ref 26.0–34.0)
MCHC: 33.5 g/dL (ref 30.0–36.0)
MCV: 89.7 fL (ref 78.0–100.0)
Platelets: 237 10*3/uL (ref 150–400)
RBC: 3.49 MIL/uL — ABNORMAL LOW (ref 4.22–5.81)
RDW: 14.7 % (ref 11.5–15.5)
WBC: 6.9 10*3/uL (ref 4.0–10.5)

## 2014-06-09 LAB — IRON AND TIBC
Iron: 83 ug/dL (ref 42–135)
SATURATION RATIOS: 40 % (ref 20–55)
TIBC: 210 ug/dL — ABNORMAL LOW (ref 215–435)
UIBC: 127 ug/dL (ref 125–400)

## 2014-06-09 LAB — HEPARIN LEVEL (UNFRACTIONATED)
HEPARIN UNFRACTIONATED: 0.61 [IU]/mL (ref 0.30–0.70)
Heparin Unfractionated: 0.77 IU/mL — ABNORMAL HIGH (ref 0.30–0.70)

## 2014-06-09 LAB — GLUCOSE, CAPILLARY
GLUCOSE-CAPILLARY: 97 mg/dL (ref 70–99)
Glucose-Capillary: 86 mg/dL (ref 70–99)
Glucose-Capillary: 107 mg/dL — ABNORMAL HIGH (ref 70–99)

## 2014-06-09 LAB — FERRITIN: FERRITIN: 625 ng/mL — AB (ref 22–322)

## 2014-06-09 LAB — RETICULOCYTES
RBC.: 3.49 MIL/uL — AB (ref 4.22–5.81)
RETIC COUNT ABSOLUTE: 41.9 10*3/uL (ref 19.0–186.0)
RETIC CT PCT: 1.2 % (ref 0.4–3.1)

## 2014-06-09 LAB — FOLATE: Folate: 19.3 ng/mL

## 2014-06-09 LAB — PROTIME-INR
INR: 1.2 (ref 0.00–1.49)
Prothrombin Time: 15.3 seconds — ABNORMAL HIGH (ref 11.6–15.2)

## 2014-06-09 LAB — VITAMIN B12: Vitamin B-12: 430 pg/mL (ref 211–911)

## 2014-06-09 MED ORDER — WARFARIN SODIUM 5 MG PO TABS
5.0000 mg | ORAL_TABLET | Freq: Once | ORAL | Status: AC
  Administered 2014-06-09: 5 mg via ORAL
  Filled 2014-06-09: qty 1

## 2014-06-09 NOTE — Care Management Note (Addendum)
    Page 1 of 2   06/12/2014     12:34:48 PM CARE MANAGEMENT NOTE 06/12/2014  Patient:  Freda JacksonBROOKS,Froylan E   Account Number:  [id number]  Date Initiated:  06/09/2014  Documentation initiated by:  GRAVES-BIGELOW,Jeanne Diefendorf  Subjective/Objective Assessment:   Pt admitted for pulmonary embolus, bilateral.     Action/Plan:   CM received consult for xarelto. CM will provide pt with 30 day free card.   Anticipated DC Date:  06/10/2014   Anticipated DC Plan:  HOME/SELF CARE      DC Planning Services  CM consult  Medication Assistance      [facility]   Choice offered to / List presented to:  C-4 Adult Children        HH arranged  HH-2 PT      HH agency  [facility]   Status of service:  Completed, signed off Medicare Important Message given?  YES (If response is "NO", the following Medicare IM given date fields will be blank) Date Medicare IM given:  06/09/2014 Medicare IM given by:  GRAVES-BIGELOW,Arjan Strohm Date Additional Medicare IM given:  06/12/2014 Additional Medicare IM given by:  Iden Stripling GRAVES-BIGELOW  Discharge Disposition:  HOME W HOME HEALTH SERVICES  Per UR Regulation:  Reviewed for med. necessity/level of care/duration of stay  If discussed at Long Length of Stay Meetings, dates discussed:    Comments:   06-12-14 1232 Tomi BambergerBrenda Graves-bIgelow, RN,BSN [phone] CM did speak to son and he states [facility] the [facility] and would like to utilize agency again. CM did make referral and SOC to begin within 24-48 hrs post d/c. No further needs from CM at this time.     06-12-14 [address] Graves-Bigelow, [place],BSN [phone] CM did speak with pt [facility]. CM called son Brett CanalesSteve, however he is on vacation as of now. If CM can not get in touch with son before d/c, CM did make caregiver aware to contact PCP for orders for [facility].       Xarelto: PT COPAY WILL BE  $45 AT RETAIL -$125 MAIL ORDER- PRIOR AUTH IS  REQUIRED [phone]

## 2014-06-09 NOTE — Progress Notes (Signed)
Medicare Important Message given? YES   (If response is "NO", the following Medicare IM given date fields will be blank)   Date Medicare IM given:   Medicare IM given by: Graves-Bigelow, Trevious Rampey  

## 2014-06-09 NOTE — Progress Notes (Signed)
ANTICOAGULATION CONSULT NOTE - Follow Up Consult  Pharmacy Consult for Coumadin and Heparin Indication: pulmonary embolus, bilateral  No Known Allergies  Patient Measurements: Height: 5' 10.87" (180 cm) Weight: 183 lb 3.2 oz (83.099 kg) IBW/kg (Calculated) : 74.99 Heparin Dosing Weight: 88 kg  Labs:  Recent Labs  06/08/14 1325 06/08/14 2318 06/09/14 0329 06/09/14 0826  HGB 10.9*  --  10.5*  --   HCT 33.6*  --  31.3*  --   PLT 255  --  237  --   APTT 32  --   --   --   LABPROT 14.0  --  15.3*  --   INR 1.07  --  1.20  --   HEPARINUNFRC  --  0.81*  --  0.77*  CREATININE 1.13  --  1.13  --   TROPONINI <0.30  --   --   --     Estimated Creatinine Clearance: 39.6 ml/min (by C-G formula based on Cr of 1.13).  Assessment: 89 YOM on Coumadin + Heparin drip for bilateral PE. Overlap day #2 of 5 minimum. HL is therapeutic this AM at 0.77. INR up to 1.2 after only 1 dose which could indicated warfarin sensitivity. Pt is mildly anemic with stable Hgb and platelets. No bleeding noted  Goal of Therapy:  INR 2-3 Heparin level 0.3-0.7 units/ml Monitor platelets by anticoagulation protocol: Yes   Plan:  -Give Coumadin 5 mg x 1 tonight. -Continue heparin drip at 1100 units/hr. -F/u 8hr HL -Daily heparin level, PT/INR and CBC. -Minimum 5 day overlap of Heparin and Coumadin, and until INR >2 for 2 consecutive days. -Coumadin education for patient and caregiver prior to discharge.  Book and video ordered.  Sampson SiMancheril, Benjamin G, [place] Pager: [phone] 06/09/2014,10:01 AM

## 2014-06-09 NOTE — Progress Notes (Signed)
UR Completed Debborah Alonge Graves-Bigelow, RN,BSN [phone]  

## 2014-06-09 NOTE — Progress Notes (Signed)
ANTICOAGULATION CONSULT NOTE - Follow Up Consult  Pharmacy Consult for Heparin, Coumadin Indication: pulmonary embolus, bilateral  No Known Allergies  Patient Measurements: Height: 5' 10.87" (180 cm) Weight: 183 lb 3.2 oz (83.099 kg) IBW/kg (Calculated) : 74.99 Heparin Dosing Weight: 88 kg  Labs:  Recent Labs  06/08/14 1325 06/08/14 2318 06/09/14 0329 06/09/14 0826 06/09/14 1658  HGB 10.9*  --  10.5*  --   --   HCT 33.6*  --  31.3*  --   --   PLT 255  --  237  --   --   APTT 32  --   --   --   --   LABPROT 14.0  --  15.3*  --   --   INR 1.07  --  1.20  --   --   HEPARINUNFRC  --  0.81*  --  0.77* 0.61  CREATININE 1.13  --  1.13  --   --   TROPONINI <0.30  --   --   --   --     Estimated Creatinine Clearance: 39.6 ml/min (by C-G formula based on Cr of 1.13).  Assessment: 89 YOM on Coumadin + Heparin drip for bilateral PE. Overlap day #2 of 5 minimum.  INR up to 1.2 after only 1 dose which could indicated warfarin sensitivity. Pt is mildly anemic with stable Hgb and platelets. No bleeding noted Heparin drip 1100 uts/hr HL 0.77 this am after rate change last pm.  Heparin level rechecked to night to ensure not increasing.  HL 0.66  Goal of Therapy:  INR 2-3 Heparin level 0.3-0.7 units/ml Monitor platelets by anticoagulation protocol: Yes   Plan:  Continue Heparin drip 1100 uts/hr  Daily CBC, HL  Leota SauersLisa Emmalise Huard Pharm.D. CPP, BCPS Clinical Pharmacist [phone] 06/09/2014 8:38 PM

## 2014-06-09 NOTE — Progress Notes (Signed)
ANTICOAGULATION CONSULT NOTE - Follow Up Consult  Pharmacy Consult for Heparin  Indication: pulmonary embolus  No Known Allergies  Patient Measurements: Height: 5' 10.87" (180 cm) Weight: 195 lb (88.451 kg) IBW/kg (Calculated) : 74.99  Vital Signs: Temp: 97.7 F (36.5 C) (10/15 2100) Temp Source: Oral (10/15 2100) BP: 124/58 mmHg (10/15 2100) Pulse Rate: 66 (10/15 2100)  Labs:  Recent Labs  06/08/14 1325 06/08/14 2318  HGB 10.9*  --   HCT 33.6*  --   PLT 255  --   APTT 32  --   LABPROT 14.0  --   INR 1.07  --   HEPARINUNFRC  --  0.81*  CREATININE 1.13  --   TROPONINI <0.30  --     Estimated Creatinine Clearance: 39.6 ml/min (by C-G formula based on Cr of 1.13).  Assessment: Heparin for BL PE, first HL is slightly elevated, other labs as above, no issues per RN.   Goal of Therapy:  Heparin level 0.3-0.7 units/ml Monitor platelets by anticoagulation protocol: Yes   Plan:  -Decrease heparin drip to 1100 units/hr -0900 HL  -Daily CBC/HL -Monitor for bleeding  Travis Becker, Travis Becker 06/09/2014,12:44 AM

## 2014-06-09 NOTE — Progress Notes (Signed)
PROGRESS NOTE  Freda JacksonCharles E Vise OZH:[id number] DOB: 1915-05-09 DOA: 06/08/2014 PCP: Lenora BoysFRIED, ROBERT L, MD  HPI/Recap of past 3524 hours: 80 year old active male with past medical history of mild dementia and GERD has been complaining for several weeks of headaches and low back pain and was noted to be hypoxic so was evaluated by pulmonary on 10/15. Sent for CT and found to have large bilateral pulmonary emboli and admitted to the hospitalist service. Patient started on Coumadin and heparin.  Today, he is doing okay. Has no complaints, although he does not have much in terms of long-term memory. Denies any chest pain or feeling short of breath.  Assessment/Plan: Principal Problem:   Pulmonary embolism: Likely from DVT given reports of leg swelling. On oxygen. Check 2-D echo. Given stage III chronic kidney disease, not candidate for new anticoagulation agents. On Coumadin and heparin. Patient's baseline is that he is quite active and has 24-hour care at home, so would prefer he be on an anti-coagulation agent. Reportedly, he used to be incredibly active up until 2 months ago and much less so now. It is probably decreased mobility of that led to his DVT Active Problems:   CKD (chronic kidney disease) stage 3, GFR 30-59 ml/min: Stable.   Senile dementia uncomp: Stable, continue Namenda and Aricept   GERD (gastroesophageal reflux disease) on PPI   Code Status: Full code  Family Communication: We'll discuss with family  Disposition Plan: We'll need to be here for several days until fully anticoagulated   Consultants:  None  Procedures:  2-D echo pending  Antibiotics:  None   Objective: BP 141/67  Pulse 63  Temp(Src) 98.1 F (36.7 C) (Oral)  Resp 20  Ht 5' 10.87" (1.8 m)  Wt 83.099 kg (183 lb 3.2 oz)  BMI 25.65 kg/m2  SpO2 93%  Intake/Output Summary (Last 24 hours) at 06/09/14 1226 Last data filed at 06/09/14 0900  Gross per 24 hour  Intake    110 ml  Output    375 ml    Net   -265 ml   Filed Weights   06/08/14 1307 06/09/14 0500  Weight: 88.451 kg (195 lb) 83.099 kg (183 lb 3.2 oz)    Exam:   General:  Alert and oriented x1, no acute distress  Cardiovascular: Regular rate and rhythm, S1-S2  Respiratory: Clear to auscultation bilaterally  Abdomen: Soft, nontender, nondistended, positive bowel sounds  Musculoskeletal: Right lower extremity is slightly enlarged compared to left, trace pitting edema bilaterally   Data Reviewed: Basic Metabolic Panel:  Recent Labs Lab 06/08/14 1325 06/09/14 0329  NA 139 141  K 4.6 4.3  CL 106 107  CO2 23 23  GLUCOSE 96 83  BUN 21 21  CREATININE 1.13 1.13  CALCIUM 8.7 8.4   Liver Function Tests:  Recent Labs Lab 06/09/14 0329  AST 30  ALT 15  ALKPHOS 73  BILITOT 0.4  PROT 6.2  ALBUMIN 2.8*   No results found for this basename: LIPASE, AMYLASE,  in the last 168 hours No results found for this basename: AMMONIA,  in the last 168 hours CBC:  Recent Labs Lab 06/08/14 1325 06/09/14 0329  WBC 8.3 6.9  HGB 10.9* 10.5*  HCT 33.6* 31.3*  MCV 91.1 89.7  PLT 255 237   Cardiac Enzymes:    Recent Labs Lab 06/08/14 1325  TROPONINI <0.30   BNP (last 3 results)  Recent Labs  05/26/14 1509  PROBNP 73.0   CBG:  Recent Labs Lab 06/09/14  1118  GLUCAP 86    No results found for this or any previous visit (from the past 240 hour(s)).   Studies: Ct Angio Chest Pe W/cm &/or Wo Cm  06/08/2014   CLINICAL DATA:  Left ankle swelling and shortness of breath over 3 months. Evaluate for pulmonary embolism.  EXAM: CT ANGIOGRAPHY CHEST WITH CONTRAST  TECHNIQUE: Multidetector CT imaging of the chest was performed using the standard protocol during bolus administration of intravenous contrast. Multiplanar CT image reconstructions and MIPs were obtained to evaluate the vascular anatomy.  CONTRAST:  80mL OMNIPAQUE IOHEXOL 350 MG/ML SOLN  COMPARISON:  Chest radiograph 04/25/2014  FINDINGS: Study is  positive for pulmonary emboli bilaterally. There is thrombus within segmental branches of the lower lobes bilaterally. Some of these occluded branches are smaller than the adjacent opacified vessels suggesting subacute or chronic thrombus. Question peripheral nonocclusive thrombus in the left main pulmonary artery but this could be artifactual. The RV/LV ratio is roughly 0.9. No significant bowing of the interventricular septum.  There is no significant chest lymphadenopathy. Atherosclerotic calcifications in the coronary arteries. No significant dilatation of the thoracic aorta and no evidence for a dissection. Images of the upper abdomen are unremarkable.  The trachea and mainstem bronchi are patent. There is an irregular streaky opacity in the right upper lung with small areas of cavitation. This area roughly measures 2.7 x 4.0 x 3.7 cm. This could be related to scarring but a neoplastic or inflammatory process cannot be excluded. There is a smaller spiculated density along the periphery of the right upper lobe measuring 0.7 cm on sequence 6, image 20. There are additional patchy and streaky densities in the anterior right lung which are nonspecific. Peripheral patchy densities in the superior segment of the left lower lobe are nonspecific but there is a nodular area in the posterior left lower lobe that measures 1.2 cm on sequence 6, image 42. There is a tiny amount of left pleural fluid. No acute bone abnormality.  Review of the MIP images confirms the above findings.  IMPRESSION: Study is positive for bilateral pulmonary emboli. This may represent subacute clot but it is age indeterminate.  There is an irregular parenchymal lesion in the right upper lung with areas of cavitation. This lesion is nonspecific. This could be related to infection, scar or even neoplasm. Comparison examinations would be helpful to evaluate for stability. There are additional scattered peripheral densities in both lungs, particularly  in the superior segment of the left lower lobe which are nonspecific.  Critical Value/emergent results were called by telephone at the time of interpretation on 06/08/2014 at 12:09 pm to Dr. Sandrea HughsMICHAEL WERT , who verbally acknowledged these results.   Electronically Signed   By: Richarda OverlieAdam  Henn M.D.   On: 06/08/2014 12:15    Scheduled Meds: . acetaminophen  650 mg Oral QHS  . busPIRone  7.5 mg Oral TID  . donepezil  10 mg Oral Daily  . Memantine HCl ER  28 mg Oral Daily  . pantoprazole  40 mg Oral QAC breakfast  . patient's guide to using coumadin book   Does not apply Once  . warfarin  5 mg Oral ONCE-1800  . warfarin   Does not apply Once  . Warfarin - Pharmacist Dosing Inpatient   Does not apply q1800    Continuous Infusions: . heparin 1,100 Units/hr (06/09/14 0957)     Time spent: 25 minutes  Hollice EspyKRISHNAN,SENDIL K  [facility] Pager [phone]. If 7PM-7AM, please contact night-coverage at  [email or web address], password [facility] 06/09/2014, 12:26 PM  LOS: 1 day

## 2014-06-09 NOTE — Discharge Instructions (Addendum)
Information on my medicine - Coumadin   (Warfarin)  This medication education was reviewed with me or my healthcare representative as part of my discharge preparation.  The pharmacist that spoke with me during my hospital stay was:  Lavenia Atlas, [facility]  Why was Coumadin prescribed for you? Coumadin was prescribed for you because you have a blood clot or a medical condition that can cause an increased risk of forming blood clots. Blood clots can cause serious health problems by blocking the flow of blood to the heart, lung, or brain. Coumadin can prevent harmful blood clots from forming. As a reminder your indication for Coumadin is:   Pulmonary Embolism Treatment  What test will check on my response to Coumadin? While on Coumadin (warfarin) you will need to have an INR test regularly to ensure that your dose is keeping you in the desired range. The INR (international normalized ratio) number is calculated from the result of the laboratory test called prothrombin time (PT).  If an INR APPOINTMENT HAS NOT ALREADY BEEN MADE FOR YOU please schedule an appointment to have this lab work done by your health care provider within 7 days. Your INR goal is usually a number between:  2 to 3 or your provider may give you a more narrow range like 2-2.5.  Ask your health care provider during an office visit what your goal INR is.  What  do you need to  know  About  COUMADIN? Take Coumadin (warfarin) exactly as prescribed by your healthcare provider about the same time each day.  DO NOT stop taking without talking to the doctor who prescribed the medication.  Stopping without other blood clot prevention medication to take the place of Coumadin may increase your risk of developing a new clot or stroke.  Get refills before you run out.  What do you do if you miss a dose? If you miss a dose, take it as soon as you remember on the same day then continue your regularly scheduled regimen the next day.  Do not  take two doses of Coumadin at the same time.  Important Safety Information A possible side effect of Coumadin (Warfarin) is an increased risk of bleeding. You should call your healthcare provider right away if you experience any of the following:   Bleeding from an injury or your nose that does not stop.   Unusual colored urine (red or dark brown) or unusual colored stools (red or black).   Unusual bruising for unknown reasons.   A serious fall or if you hit your head (even if there is no bleeding).  Some foods or medicines interact with Coumadin (warfarin) and might alter your response to warfarin. To help avoid this:   Eat a balanced diet, maintaining a consistent amount of Vitamin K.   Notify your provider about major diet changes you plan to make.   Avoid alcohol or limit your intake to 1 drink for women and 2 drinks for men per day. (1 drink is 5 oz. wine, 12 oz. beer, or 1.5 oz. liquor.)  Make sure that ANY health care provider who prescribes medication for you knows that you are taking Coumadin (warfarin).  Also make sure the healthcare provider who is monitoring your Coumadin knows when you have started a new medication including herbals and non-prescription products.  Coumadin (Warfarin)  Major Drug Interactions  Increased Warfarin Effect Decreased Warfarin Effect  Alcohol (large quantities) Antibiotics (esp. Septra/Bactrim, Flagyl, Cipro) Amiodarone (Cordarone) Aspirin (ASA) Cimetidine (Tagamet)  Megestrol (Megace) NSAIDs (ibuprofen, naproxen, etc.) Piroxicam (Feldene) Propafenone (Rythmol SR) Propranolol (Inderal) Isoniazid (INH) Posaconazole (Noxafil) Barbiturates (Phenobarbital) Carbamazepine (Tegretol) Chlordiazepoxide (Librium) Cholestyramine (Questran) Griseofulvin Oral Contraceptives Rifampin Sucralfate (Carafate) Vitamin K   Coumadin (Warfarin) Major Herbal Interactions  Increased Warfarin Effect Decreased Warfarin Effect  Garlic Ginseng Ginkgo biloba  Coenzyme Q10 Green tea St. Johns wort    Coumadin (Warfarin) FOOD Interactions  Eat a consistent number of servings per week of foods HIGH in Vitamin K (1 serving =  cup)  Collards (cooked, or boiled & drained) Kale (cooked, or boiled & drained) Mustard greens (cooked, or boiled & drained) Parsley *serving size only =  cup Spinach (cooked, or boiled & drained) Swiss chard (cooked, or boiled & drained) Turnip greens (cooked, or boiled & drained)  Eat a consistent number of servings per week of foods MEDIUM-HIGH in Vitamin K (1 serving = 1 cup)  Asparagus (cooked, or boiled & drained) Broccoli (cooked, boiled & drained, or raw & chopped) Brussel sprouts (cooked, or boiled & drained) *serving size only =  cup Lettuce, raw (green leaf, endive, romaine) Spinach, raw Turnip greens, raw & chopped   These websites have more information on Coumadin (warfarin):  http://www.king-russell.com/; https://www.hines.net/;   Deep Vein Thrombosis A deep vein thrombosis (DVT) is a blood clot that develops in the deep, larger veins of the leg, arm, or pelvis. These are more dangerous than clots that might form in veins near the surface of the body. A DVT can lead to serious and even life-threatening complications if the clot breaks off and travels in the bloodstream to the lungs.  A DVT can damage the valves in your leg veins so that instead of flowing upward, the blood pools in the lower leg. This is called post-thrombotic syndrome, and it can result in pain, swelling, discoloration, and sores on the leg. CAUSES Usually, several things contribute to the formation of blood clots. Contributing factors include:  The flow of blood slows down.  The inside of the vein is damaged in some way.  You have a condition that makes blood clot more easily. RISK FACTORS Some people are more likely than others to develop blood clots. Risk factors include:   Smoking.  Being overweight (obese).  Sitting  or lying still for a long time. This includes long-distance travel, paralysis, or recovery from an illness or surgery. Other factors that increase risk are:   Older age, especially over 48 years of age.  Having a family history of blood clots or if you have already had a blot clot.  Having major or lengthy surgery. This is especially true for surgery on the hip, knee, or belly (abdomen). Hip surgery is particularly high risk.  Having a long, thin tube (catheter) placed inside a vein during a medical procedure.  Breaking a hip or leg.  Having cancer or cancer treatment.  Pregnancy and childbirth.  Hormone changes make the blood clot more easily during pregnancy.  The fetus puts pressure on the veins of the pelvis.  There is a risk of injury to veins during delivery or a caesarean delivery. The risk is highest just after childbirth.  Medicines containing the male hormone estrogen. This includes birth control pills and hormone replacement therapy.  Other circulation or heart problems.  SIGNS AND SYMPTOMS When a clot forms, it can either partially or totally block the blood flow in that vein. Symptoms of a DVT can include:  Swelling of the leg or arm, especially if one side is  much worse.  Warmth and redness of the leg or arm, especially if one side is much worse.  Pain in an arm or leg. If the clot is in the leg, symptoms may be more noticeable or worse when standing or walking. The symptoms of a DVT that has traveled to the lungs (pulmonary embolism, PE) usually start suddenly and include:  Shortness of breath.  Coughing.  Coughing up blood or blood-tinged mucus.  Chest pain. The chest pain is often worse with deep breaths.  Rapid heartbeat. Anyone with these symptoms should get emergency medical treatment right away. Do not wait to see if the symptoms will go away. Call your local emergency services (911 in the [place] if you have these symptoms. Do not drive yourself to  the hospital. DIAGNOSIS If a DVT is suspected, your health care provider will take a full medical history and perform a physical exam. Tests that also may be required include:  Blood tests, including studies of the clotting properties of the blood.  Ultrasound to see if you have clots in your legs or lungs.  X-rays to show the flow of blood when dye is injected into the veins (venogram).  Studies of your lungs if you have any chest symptoms. PREVENTION  Exercise the legs regularly. Take a brisk 30-minute walk every day.  Maintain a weight that is appropriate for your height.  Avoid sitting or lying in bed for long periods of time without moving your legs.  Women, particularly those over the age of 35 years, should consider the risks and benefits of taking estrogen medicines, including birth control pills.  Do not smoke, especially if you take estrogen medicines.  Long-distance travel can increase your risk of DVT. You should exercise your legs by walking or pumping the muscles every hour.  Many of the risk factors above relate to situations that exist with hospitalization, either for illness, injury, or elective surgery. Prevention may include medical and nonmedical measures.  Your health care provider will assess you for the need for venous thromboembolism prevention when you are admitted to the hospital. If you are having surgery, your surgeon will assess you the day of or day after surgery. TREATMENT Once identified, a DVT can be treated. It can also be prevented in some circumstances. Once you have had a DVT, you may be at increased risk for a DVT in the future. The most common treatment for DVT is blood-thinning (anticoagulant) medicine, which reduces the blood's tendency to clot. Anticoagulants can stop new blood clots from forming and stop old clots from growing. They cannot dissolve existing clots. Your body does this by itself over time. Anticoagulants can be given by mouth,  through an IV tube, or by injection. Your health care provider will determine the best program for you. Other medicines or treatments that may be used are:  Heparin or related medicines (low molecular weight heparin) are often the first treatment for a blood clot. They act quickly. However, they cannot be taken orally and must be given either in shot form or by IV tube.  Heparin can cause a fall in a component of blood that stops bleeding and forms blood clots (platelets). You will be monitored with blood tests to be sure this does not occur.  Warfarin is an anticoagulant that can be swallowed. It takes a few days to start working, so usually heparin or related medicines are used in combination. Once warfarin is working, heparin is usually stopped.  Factor Xa inhibitor medicines,  such as rivaroxaban and apixaban, also reduce blood clotting. These medicines are taken orally and can often be used without heparin or related medicines.  Less commonly, clot dissolving drugs (thrombolytics) are used to dissolve a DVT. They carry a high risk of bleeding, so they are used mainly in severe cases where your life or a part of your body is threatened.  Very rarely, a blood clot in the leg needs to be removed surgically.  If you are unable to take anticoagulants, your health care provider may arrange for you to have a filter placed in a main vein in your abdomen. This filter prevents clots from traveling to your lungs. HOME CARE INSTRUCTIONS  Take all medicines as directed by your health care provider.  Learn as much as you can about DVT.  Wear a medical alert bracelet or carry a medical alert card.  Ask your health care provider how soon you can go back to normal activities. It is important to stay active to prevent blood clots. If you are on anticoagulant medicine, avoid contact sports.  It is very important to exercise. This is especially important while traveling, sitting, or standing for long  periods of time. Exercise your legs by walking or by tightening and relaxing your leg muscles regularly. Take frequent walks.  You may need to wear compression stockings. These are tight elastic stockings that apply pressure to the lower legs. This pressure can help keep the blood in the legs from clotting. Taking Warfarin Warfarin is a daily medicine that is taken by mouth. Your health care provider will advise you on the length of treatment (usually 3-6 months, sometimes lifelong). If you take warfarin:  Understand how to take warfarin and foods that can affect how warfarin works in Public relations account executive.  Too much and too little warfarin are both dangerous. Too much warfarin increases the risk of bleeding. Too little warfarin continues to allow the risk for blood clots. Warfarin and Regular Blood Testing While taking warfarin, you will need to have regular blood tests to measure your blood clotting time. These blood tests usually include both the prothrombin time (PT) and international normalized ratio (INR) tests. The PT and INR results allow your health care provider to adjust your dose of warfarin. It is very important that you have your PT and INR tested as often as directed by your health care provider.  Warfarin and Your Diet Avoid major changes in your diet, or notify your health care provider before changing your diet. Arrange a visit with a registered dietitian to answer your questions. Many foods, especially foods high in vitamin K, can interfere with warfarin and affect the PT and INR results. You should eat a consistent amount of foods high in vitamin K. Foods high in vitamin K include:   Spinach, kale, broccoli, cabbage, collard and turnip greens, Brussels sprouts, peas, cauliflower, seaweed, and parsley.  Beef and pork liver.  Green tea.  Soybean oil. Warfarin with Other Medicines Many medicines can interfere with warfarin and affect the PT and INR results. You must:  Tell your health  care provider about any and all medicines, vitamins, and supplements you take, including aspirin and other over-the-counter anti-inflammatory medicines. Be especially cautious with aspirin and anti-inflammatory medicines. Ask your health care provider before taking these.  Do not take or discontinue any prescribed or over-the-counter medicine except on the advice of your health care provider or pharmacist. Warfarin Side Effects Warfarin can have side effects, such as easy bruising and difficulty  stopping bleeding. Ask your health care provider or pharmacist about other side effects of warfarin. You will need to:  Hold pressure over cuts for longer than usual.  Notify your dentist and other health care providers that you are taking warfarin before you undergo any procedures where bleeding may occur. Warfarin with Alcohol and Tobacco   Drinking alcohol frequently can increase the effect of warfarin, leading to excess bleeding. It is best to avoid alcoholic drinks or to consume only very small amounts while taking warfarin. Notify your health care provider if you change your alcohol intake.   Do not use any tobacco products including cigarettes, chewing tobacco, or electronic cigarettes. If you smoke, quit. Ask your health care provider for help with quitting smoking. Alternative Medicines to Warfarin: Factor Xa Inhibitor Medicines  These blood-thinning medicines are taken by mouth, usually for several weeks or longer. It is important to take the medicine every single day at the same time each day.  There are no regular blood tests required when using these medicines.  There are fewer food and drug interactions than with warfarin.  The side effects of this class of medicine are similar to those of warfarin, including excessive bruising or bleeding. Ask your health care provider or pharmacist about other potential side effects. SEEK MEDICAL CARE IF:  You notice a rapid heartbeat.  You feel  weaker or more tired than usual.  You feel faint.  You notice increased bruising.  You feel your symptoms are not getting better in the time expected.  You believe you are having side effects of medicine. SEEK IMMEDIATE MEDICAL CARE IF:  You have chest pain.  You have trouble breathing.  You have new or increased swelling or pain in one leg.  You cough up blood.  You notice blood in vomit, in a bowel movement, or in urine. MAKE SURE YOU:  Understand these instructions.  Will watch your condition.  Will get help right away if you are not doing well or get worse. Document Released: 08/11/2005 Document Revised: 12/26/2013 Document Reviewed: 04/18/2013 [facility] Patient Information 2015 [employer], [place]. This information is not intended to replace advice given to you by your health care provider. Make sure you discuss any questions you have with your health care provider.  Warfarin Coagulopathy Warfarin (Coumadin) coagulopathy refers to bleeding that may occur as a complication of the medicine warfarin. Warfarin is an oral blood thinner (anticoagulant). Warfarin is used for medical conditions where thinning of the blood is needed to prevent blood clots.  CAUSES Bleeding is the most common and most serious complication of warfarin. The amount of bleeding is related to the warfarin dose and length of treatment. In addition, bleeding complications can also occur due to:  Intentional or accidental warfarin overdose.  Underlying medical conditions.  Dietary changes.  Medicine, herbal, supplement, or alcohol interactions. SYMPTOMS Severe bleeding while on warfarin may occur from any tissue or organ. Symptoms of the blood being too thin may include:  Bleeding from the nose or gums.  Blood in bowel movements which may appear as bright red, dark, or black tarry stools.  Blood in the urine which may appear as pink, red, or brown urine.  Unusual bruising or bruising easily.  A  cut that does not stop bleeding within 10 minutes.  Vomiting blood or continuous nausea for more than 1 day.  Coughing up blood.  Broken blood vessels in your eye (subconjunctival hemorrhage).  Abdominal or back pain with or without flank bruising.  Sudden, severe headache.  Sudden weakness or numbness of the face, arm, or leg, especially on one side of the body.  Sudden confusion.  Trouble speaking (aphasia) or understanding.  Sudden trouble seeing in one or both eyes.  Sudden trouble walking.  Dizziness.  Loss of balance or coordination.  Vaginal bleeding.  Swelling or pain at an injection site.  Superficial fat tissue death (necrosis) which may cause skin scarring. This is more common in women and may first present as pain in the waist, thighs, or buttocks.  Fever. HOME CARE INSTRUCTIONS  Always contact your health care provider of any concerns or signs of possible warfarin coagulopathy as soon as possible.  Take warfarin exactly as directed by your health care provider. It is recommended that you take your warfarin dose at the same time of the day. If you have been told to stop taking warfarin, do not resume taking warfarin until directed to do so by your health care provider. Follow your health care provider's instructions if you accidentally take an extra dose or miss a dose of warfarin. It is very important to take warfarin as directed since bleeding or blood clots could result in chronic or permanent injury, pain, or disability.  Keep all follow-up appointments with your health care provider as directed. It is very important to keep your appointments. Not keeping appointments could result in a chronic or permanent injury, pain, or disability because warfarin is a medicine that requires close monitoring.  While taking warfarin, you will need to have regular blood tests to measure your blood clotting time. These blood tests usually include both the prothrombin time (PT)  and International Normalized Ratio (INR) tests. The PT and INR results allow your health care provider to adjust your dose of warfarin. The dose can change for many reasons. It is critically important that you have your PT and INR levels drawn exactly as directed. Your warfarin dose may stay the same or change depending on what the PT and INR results are. Be sure to follow up with your health care provider regarding your PT and INR test results and what your warfarin dosage should be.  Many medicines can interfere with warfarin and affect the PT and INR results. You must tell your health care provider about any and all medicines you take. This includes all vitamins and supplements. Ask your health care provider before taking these. Prescription and over-the-counter medicine consistency is critical to warfarin management. It is important that potential interactions are checked before you start a new medicine. Be especially cautious with aspirin and anti-inflammatory medicines. Ask your health care provider before taking these. Medicines such as antibiotics and acid-reducing medicine can interact with warfarin and can cause an increased warfarin effect. Warfarin can also interfere with the effectiveness of medicines you are taking. Do not take or discontinue any prescribed or over-the-counter medicine except on the advice of your health care provider or pharmacist.  Some vitamins, supplements, and herbal products interfere with the effectiveness of warfarin. Vitamin E may increase the anticoagulant effects of warfarin. Vitamin K can cause warfarin to be less effective. Do not take or discontinue any vitamin, supplement, or herbal product except on the advice of your health care provider or pharmacist.  Eat what you normally eat and keep the vitamin K content of your diet consistent. Avoid major changes in your diet, or notify your health care provider before changing your diet. Suddenly getting a lot more  vitamin K could cause your blood to  clot too quickly. A sudden decrease in vitamin K intake could cause your blood to clot too slowly. These changes in vitamin K intake could lead to dangerous blood clotsor to bleeding. To keep your vitamin K intake consistent, you must be aware of which foods contain moderate or high amounts of vitamin K. Some foods high in vitamin K include spinach, kale, broccoli, cabbage, greens, Brussels sprouts, asparagus, bok choy, coleslaw, parsley, and green tea. Arrange a visit with a dietitian to answer your questions.  If you have a loss of appetite or get the stomach flu (viral gastroenteritis), talk to your health care provider as soon as possible. A decrease in your normal vitamin K intake can make you more sensitive to your usual dose of warfarin.  Some medical conditions may increase your risk for bleeding while you are taking warfarin. A fever, diarrhea lasting more than a day, worsening heart failure, or worsening liver function are some medical conditions that could affect warfarin. Contact your health care provider if you have any of these medical conditions.  Be careful not to cut yourself when using sharp objects or while shaving.  Alcohol can change the body's ability to handle warfarin. It is best to avoid alcoholic drinks or consume only very small amounts while taking warfarin. Notify your health care provider if you change your alcohol intake. A sudden increase in alcohol use can increase your risk of bleeding. Chronic alcohol use can cause warfarin to be less effective.  Limit physical activities or sports that could result in a fall or cause injury.  Do not use warfarin if you are pregnant.  Inform all your health care providers and your dentist that you take warfarin.  Inform all health care providers if you are taking warfarin and aspirin or platelet inhibitor medicines such as clopidogrel, ticagrelor, or prasugrel. Use of these medicines in addition  to warfarin can increase your risk of bleeding or death. Taking these medicines together should only be done under the direct care of your health care providers. SEEK IMMEDIATE MEDICAL CARE IF:  You cough up blood.  You have dark or black stools or there is bright red blood coming from your rectum.  You vomit blood or have nausea for more than 1 day.  You have blood in the urine or pink-colored urine.  You have unusual bruising or have increased bruising.  You have bleeding from the nose or gums that does not stop quickly.  You have a cut that does not stop bleeding within 2-3 minutes.  You have sudden weakness or numbness of the face, arm, or leg, especially on one side of the body.  You have sudden confusion.  You have trouble speaking (aphasia) or understanding.  You have sudden trouble seeing in one or both eyes.  You have sudden trouble walking.  You have dizziness.  You have a loss of balance or coordination.  You have a sudden, severe headache.  You have a serious fall or head injury, even if you are not bleeding.  You have swelling or pain at an injection site.  You have unexplained tenderness or pain in the abdomen, back, waist, thighs, or buttocks.  You have a fever. Any of these symptoms may represent a serious problem that is an emergency. Do not wait to see if the symptoms will go away. Get medical help right away. Call your local emergency services (911 in [place] Do not drive yourself to the hospital. Document Released: 07/20/2006 Document Revised: 12/26/2013 Document  Reviewed: 01/20/2012 ExitCare Patient Information 2015 [place], [place]. This information is not intended to replace advice given to you by your health care provider. Make sure you discuss any questions you have with your health care provider.  Cardiac Diet This diet can help prevent heart disease and stroke. Many factors influence your heart health, including eating and exercise habits.  Coronary risk rises a lot with abnormal blood fat (lipid) levels. Cardiac meal planning includes limiting unhealthy fats, increasing healthy fats, and making other small dietary changes. General guidelines are as follows:  Adjust calorie intake to reach and maintain desirable body weight.  Limit total fat intake to less than 30% of total calories. Saturated fat should be less than 7% of calories.  Saturated fats are found in animal products and in some vegetable products. Saturated vegetable fats are found in coconut oil, cocoa butter, palm oil, and palm kernel oil. Read labels carefully to avoid these products as much as possible. Use butter in moderation. Choose tub margarines and oils that have 2 grams of fat or less. Good cooking oils are canola and olive oils.  Practice low-fat cooking techniques. Do not fry food. Instead, broil, bake, boil, steam, grill, roast on a rack, stir-fry, or microwave it. Other fat reducing suggestions include:  Remove the skin from poultry.  Remove all visible fat from meats.  Skim the fat off stews, soups, and gravies before serving them.  Steam vegetables in water or broth instead of sauting them in fat.  Avoid foods with trans fat (or hydrogenated oils), such as commercially fried foods and commercially baked goods. Commercial shortening and deep-frying fats will contain trans fat.  Increase intake of fruits, vegetables, whole grains, and legumes to replace foods high in fat.  Increase consumption of nuts, legumes, and seeds to at least 4 servings weekly. One serving of a legume equals  cup, and 1 serving of nuts or seeds equals  cup.  Choose whole grains more often. Have 3 servings per day (a serving is 1 ounce [oz]).  Eat 4 to 5 servings of vegetables per day. A serving of vegetables is 1 cup of raw leafy vegetables;  cup of raw or cooked cut-up vegetables;  cup of vegetable juice.  Eat 4 to 5 servings of fruit per day. A serving of fruit is 1  medium whole fruit;  cup of dried fruit;  cup of fresh, frozen, or canned fruit;  cup of 100% fruit juice.  Increase your intake of dietary fiber to 20 to 30 grams per day. Insoluble fiber may help lower your risk of heart disease and may help curb your appetite.  Soluble fiber binds cholesterol to be removed from the blood. Foods high in soluble fiber are dried beans, citrus fruits, oats, apples, bananas, broccoli, Brussels sprouts, and eggplant.  Try to include foods fortified with plant sterols or stanols, such as yogurt, breads, juices, or margarines. Choose several fortified foods to achieve a daily intake of 2 to 3 grams of plant sterols or stanols.  Foods with omega-3 fats can help reduce your risk of heart disease. Aim to have a 3.5 oz portion of fatty fish twice per week, such as salmon, mackerel, albacore tuna, sardines, lake trout, or herring. If you wish to take a fish oil supplement, choose one that contains 1 gram of both DHA and EPA.  Limit processed meats to 2 servings (3 oz portion) weekly.  Limit the sodium in your diet to 1500 milligrams (mg) per day. If you  have high blood pressure, talk to a registered dietitian about a DASH (Dietary Approaches to Stop Hypertension) eating plan.  Limit sweets and beverages with added sugar, such as soda, to no more than 5 servings per week. One serving is:   1 tablespoon sugar.  1 tablespoon jelly or jam.   cup sorbet.  1 cup lemonade.   cup regular soda. CHOOSING FOODS Starches  Allowed: Breads: All kinds (wheat, rye, raisin, white, oatmeal, [place], [place], and English muffin bread). Low-fat rolls: English muffins, frankfurter and hamburger buns, bagels, pita bread, tortillas (not fried). Pancakes, waffles, biscuits, and muffins made with recommended oil.  Avoid: Products made with saturated or trans fats, oils, or whole milk products. Butter rolls, cheese breads, croissants. Commercial doughnuts, muffins, sweet rolls,  biscuits, waffles, pancakes, store-bought mixes. Crackers  Allowed: Low-fat crackers and snacks: Animal, graham, rye, saltine (with recommended oil, no lard), oyster, and matzo crackers. Bread sticks, melba toast, rusks, flatbread, pretzels, and light popcorn.  Avoid: High-fat crackers: cheese crackers, butter crackers, and those made with coconut, palm oil, or trans fat (hydrogenated oils). Buttered popcorn. Cereals  Allowed: Hot or cold whole-grain cereals.  Avoid: Cereals containing coconut, hydrogenated vegetable fat, or animal fat. Potatoes / Pasta / Rice  Allowed: All kinds of potatoes, rice, and pasta (such as macaroni, spaghetti, and noodles).  Avoid: Pasta or rice prepared with cream sauce or high-fat cheese. Chow mein noodles, [place] fries. Vegetables  Allowed: All vegetables and vegetable juices.  Avoid: Fried vegetables. Vegetables in cream, butter, or high-fat cheese sauces. Limit coconut. Fruit in cream or custard. Protein  Allowed: Limit your intake of meat, seafood, and poultry to no more than 6 oz (cooked weight) per day. All lean, well-trimmed beef, veal, pork, and lamb. All chicken and [place] without skin. All fish and shellfish. Wild game: wild duck, rabbit, pheasant, and venison. Egg whites or low-cholesterol egg substitutes may be used as desired. Meatless dishes: recipes with dried beans, peas, lentils, and tofu (soybean curd). Seeds and nuts: all seeds and most nuts.  Avoid: Prime grade and other heavily marbled and fatty meats, such as short ribs, spare ribs, rib eye roast or steak, frankfurters, sausage, bacon, and high-fat luncheon meats, mutton. Caviar. Commercially fried fish. Domestic duck, goose, venison sausage. Organ meats: liver, gizzard, heart, chitterlings, brains, kidney, sweetbreads. Dairy  Allowed: Low-fat cheeses: nonfat or low-fat cottage cheese (1% or 2% fat), cheeses made with part skim milk, such as mozzarella, farmers, string, or ricotta.  (Cheeses should be labeled no more than 2 to 6 grams fat per oz.). Skim (or 1%) milk: liquid, powdered, or evaporated. Buttermilk made with low-fat milk. Drinks made with skim or low-fat milk or cocoa. Chocolate milk or cocoa made with skim or low-fat (1%) milk. Nonfat or low-fat yogurt.  Avoid: Whole milk cheeses, including colby, cheddar, [place], [address], [place], [place], [place], [address], [place], and blue. Creamed cottage cheese, cream cheese. Whole milk and whole milk products, including buttermilk or yogurt made from whole milk, drinks made from whole milk. Condensed milk, evaporated whole milk, and 2% milk. Soups and Combination Foods  Allowed: Low-fat low-sodium soups: broth, dehydrated soups, homemade broth, soups with the fat removed, homemade cream soups made with skim or low-fat milk. Low-fat spaghetti, lasagna, chili, and Spanish rice if low-fat ingredients and low-fat cooking techniques are used.  Avoid: Cream soups made with whole milk, cream, or high-fat cheese. All other soups. Desserts and Sweets  Allowed: Sherbet, fruit ices, gelatins, meringues, and angel food cake.  Homemade desserts with recommended fats, oils, and milk products. Jam, jelly, honey, marmalade, sugars, and syrups. Pure sugar candy, such as gum drops, hard candy, jelly beans, marshmallows, mints, and small amounts of dark chocolate.  Avoid: Commercially prepared cakes, pies, cookies, frosting, pudding, or mixes for these products. Desserts containing whole milk products, chocolate, coconut, lard, palm oil, or palm kernel oil. Ice cream or ice cream drinks. Candy that contains chocolate, coconut, butter, hydrogenated fat, or unknown ingredients. Buttered syrups. Fats and Oils  Allowed: Vegetable oils: safflower, sunflower, corn, soybean, cottonseed, sesame, canola, olive, or peanut. Non-hydrogenated margarines. Salad dressing or mayonnaise: homemade or commercial, made with a recommended oil. Low or nonfat  salad dressing or mayonnaise.  Limit added fats and oils to 6 to 8 tsp per day (includes fats used in cooking, baking, salads, and spreads on bread). Remember to count the "hidden fats" in foods.  Avoid: Solid fats and shortenings: butter, lard, salt pork, bacon drippings. Gravy containing meat fat, shortening, or suet. Cocoa butter, coconut. Coconut oil, palm oil, palm kernel oil, or hydrogenated oils: these ingredients are often used in bakery products, nondairy creamers, whipped toppings, candy, and commercially fried foods. Read labels carefully. Salad dressings made of unknown oils, sour cream, or cheese, such as blue cheese and Roquefort. Cream, all kinds: half-and-half, light, heavy, or whipping. Sour cream or cream cheese (even if "light" or low-fat). Nondairy cream substitutes: coffee creamers and sour cream substitutes made with palm, palm kernel, hydrogenated oils, or coconut oil. Beverages  Allowed: Coffee (regular or decaffeinated), tea. Diet carbonated beverages, mineral water. Alcohol: Check with your caregiver. Moderation is recommended.  Avoid: Whole milk, regular sodas, and juice drinks with added sugar. Condiments  Allowed: All seasonings and condiments. Cocoa powder. "Cream" sauces made with recommended ingredients.  Avoid: Carob powder made with hydrogenated fats. SAMPLE MENU Breakfast   cup orange juice   cup oatmeal  1 slice toast  1 tsp margarine  1 cup skim milk Lunch  [place] sandwich with 2 oz [place], 2 slices bread  Lettuce and tomato slices  Fresh fruit  Carrot sticks  Coffee or tea Snack  Fresh fruit or low-fat crackers Dinner  3 oz lean ground beef  1 baked potato  1 tsp margarine   cup asparagus  Lettuce salad  1 tbs non-creamy dressing   cup peach slices  1 cup skim milk Document Released: 05/20/2008 Document Revised: 02/10/2012 Document Reviewed: 10/11/2013 ExitCare Patient Information 2015 [place]. This  information is not intended to replace advice given to you by your health care provider. Make sure you discuss any questions you have with your health care provider.

## 2014-06-10 DIAGNOSIS — I2699 Other pulmonary embolism without acute cor pulmonale: Secondary | ICD-10-CM

## 2014-06-10 DIAGNOSIS — I059 Rheumatic mitral valve disease, unspecified: Secondary | ICD-10-CM

## 2014-06-10 LAB — HEPARIN LEVEL (UNFRACTIONATED): Heparin Unfractionated: 0.51 IU/mL (ref 0.30–0.70)

## 2014-06-10 LAB — CBC
HEMATOCRIT: 31.2 % — AB (ref 39.0–52.0)
Hemoglobin: 10.5 g/dL — ABNORMAL LOW (ref 13.0–17.0)
MCH: 30.1 pg (ref 26.0–34.0)
MCHC: 33.7 g/dL (ref 30.0–36.0)
MCV: 89.4 fL (ref 78.0–100.0)
Platelets: 227 10*3/uL (ref 150–400)
RBC: 3.49 MIL/uL — ABNORMAL LOW (ref 4.22–5.81)
RDW: 14.5 % (ref 11.5–15.5)
WBC: 7 10*3/uL (ref 4.0–10.5)

## 2014-06-10 LAB — PROTIME-INR
INR: 1.38 (ref 0.00–1.49)
PROTHROMBIN TIME: 17.1 s — AB (ref 11.6–15.2)

## 2014-06-10 MED ORDER — WARFARIN SODIUM 5 MG PO TABS
5.0000 mg | ORAL_TABLET | Freq: Once | ORAL | Status: AC
  Administered 2014-06-10: 5 mg via ORAL
  Filled 2014-06-10: qty 1

## 2014-06-10 NOTE — Progress Notes (Signed)
Pt ambulated to hall and back on RA, O2 saturation remained at 95%, no c/o SOB or pain, will continue to monitor.

## 2014-06-10 NOTE — Progress Notes (Signed)
Echocardiogram 2D Echocardiogram has been performed.  Travis Becker, Travis Becker M 06/10/2014, 12:51 PM

## 2014-06-10 NOTE — Progress Notes (Signed)
PROGRESS NOTE  Freda JacksonCharles E Willmann UXL:[id number] DOB: Dec 30, 1914 DOA: 06/08/2014 PCP: Lenora BoysFRIED, ROBERT L, MD  HPI/Recap of past 3224 hours: 80 year old active male with past medical history of mild dementia and GERD has been complaining for several weeks of headaches and low back pain and was noted to be hypoxic so was evaluated by pulmonary on 10/15. Sent for CT and found to have large bilateral pulmonary emboli and admitted to the hospitalist service. Patient started on Coumadin and heparin.  Lower extremity Dopplers done noting partial DVT subacute in the gastroc veins on the right and partial acute DVT involving common femoral on the left plus occlusive acute involving femoral, or from the femoral and popliteal.   Patient himself tolerating anticoagulation. No complaints. No pain or shortness of breath  Assessment/Plan: Principal Problem:   Pulmonary embolism: From DVT, likely from decreased mobility over last 2 months. On oxygen. Awaiting echocardiogram. Given stage III chronic kidney disease, not candidate for new anticoagulation agents. On Coumadin and heparin. Patient's baseline is that he is quite active and has 24-hour care at home, so would prefer he be on an anti-coagulation agent.  Active Problems:   CKD (chronic kidney disease) stage 3, GFR 30-59 ml/min: Stable.    Senile dementia uncomp: Stable, continue Namenda and Aricept    GERD (gastroesophageal reflux disease) on PPI  Chronic headaches: CT ordered as patient reportedly was complaining of increased headaches over the last month, however unremarkable other than atrophy.  Code Status: Full code  Family Communication: Plan discussed with caregiver  Disposition Plan: Home once fully anticoagulated  Consultants:  None  Procedures:  2-D echo pending  Antibiotics:  None   Objective: BP 119/57  Pulse 80  Temp(Src) 98 F (36.7 C) (Oral)  Resp 20  Ht 5' 10.87" (1.8 m)  Wt 83.19 kg (183 lb 6.4 oz)  BMI 25.68  kg/m2  SpO2 95%  Intake/Output Summary (Last 24 hours) at 06/10/14 1026 Last data filed at 06/10/14 0949  Gross per 24 hour  Intake    587 ml  Output    700 ml  Net   -113 ml   Filed Weights   06/08/14 1307 06/09/14 0500 06/10/14 0458  Weight: 88.451 kg (195 lb) 83.099 kg (183 lb 3.2 oz) 83.19 kg (183 lb 6.4 oz)    Exam:   General:  Alert and oriented x1, breathing somewhat labored  Cardiovascular: Regular rate and rhythm, S1-S2  Respiratory: Clear to auscultation bilaterally  Abdomen: Soft, nontender, nondistended, positive bowel sounds  Musculoskeletal: Right lower extremity is slightly enlarged compared to left, trace pitting edema bilaterally   Data Reviewed: Basic Metabolic Panel:  Recent Labs Lab 06/08/14 1325 06/09/14 0329  NA 139 141  K 4.6 4.3  CL 106 107  CO2 23 23  GLUCOSE 96 83  BUN 21 21  CREATININE 1.13 1.13  CALCIUM 8.7 8.4   Liver Function Tests:  Recent Labs Lab 06/09/14 0329  AST 30  ALT 15  ALKPHOS 73  BILITOT 0.4  PROT 6.2  ALBUMIN 2.8*   No results found for this basename: LIPASE, AMYLASE,  in the last 168 hours No results found for this basename: AMMONIA,  in the last 168 hours CBC:  Recent Labs Lab 06/08/14 1325 06/09/14 0329 06/10/14 0332  WBC 8.3 6.9 7.0  HGB 10.9* 10.5* 10.5*  HCT 33.6* 31.3* 31.2*  MCV 91.1 89.7 89.4  PLT 255 237 227   Cardiac Enzymes:    Recent Labs Lab 06/08/14 1325  TROPONINI <0.30   BNP (last 3 results)  Recent Labs  05/26/14 1509  PROBNP 73.0   CBG:  Recent Labs Lab 06/09/14 1118 06/09/14 1647 06/09/14 2133  GLUCAP 86 97 107*    No results found for this or any previous visit (from the past 240 hour(s)).   Studies: Ct Head Wo Contrast  06/09/2014   CLINICAL DATA:  Acute headache.  EXAM: CT HEAD WITHOUT CONTRAST  TECHNIQUE: Contiguous axial images were obtained from the base of the skull through the vertex without intravenous contrast.  COMPARISON:  None.  FINDINGS: Bony  calvarium appears intact. Mild diffuse cortical atrophy is noted. Mild chronic ischemic white matter disease is noted. No mass effect or midline shift is noted. Ventricular size is within normal limits. There is no evidence of mass lesion, hemorrhage or acute infarction.  IMPRESSION: Mild diffuse cortical atrophy. Mild chronic ischemic white matter disease. No acute intracranial abnormality seen.   Electronically Signed   By: Roque LiasJames  Green M.D.   On: 06/09/2014 13:24    Scheduled Meds: . acetaminophen  650 mg Oral QHS  . busPIRone  7.5 mg Oral TID  . donepezil  10 mg Oral Daily  . Memantine HCl ER  28 mg Oral Daily  . pantoprazole  40 mg Oral QAC breakfast  . patient's guide to using coumadin book   Does not apply Once  . warfarin  5 mg Oral ONCE-1800  . warfarin   Does not apply Once  . Warfarin - Pharmacist Dosing Inpatient   Does not apply q1800    Continuous Infusions: . heparin 1,100 Units/hr (06/10/14 0700)     Time spent: 15 minutes  Hollice EspyKRISHNAN,Minette Manders K  [facility] Pager [phone]. If 7PM-7AM, please contact night-coverage at [email or web address], password [facility] 06/10/2014, 10:26 AM  LOS: 2 days

## 2014-06-10 NOTE — Progress Notes (Signed)
ANTICOAGULATION CONSULT NOTE - Follow Up Consult  Pharmacy Consult for Heparin, Coumadin Indication: pulmonary embolus, bilateral  No Known Allergies  Patient Measurements: Height: 5' 10.87" (180 cm) Weight: 183 lb 6.4 oz (83.19 kg) IBW/kg (Calculated) : 74.99 Heparin Dosing Weight: 88 kg  Labs:  Recent Labs  06/08/14 1325  06/09/14 0329 06/09/14 0826 06/09/14 1658 06/10/14 0332  HGB 10.9*  --  10.5*  --   --  10.5*  HCT 33.6*  --  31.3*  --   --  31.2*  PLT 255  --  237  --   --  227  APTT 32  --   --   --   --   --   LABPROT 14.0  --  15.3*  --   --  17.1*  INR 1.07  --  1.20  --   --  1.38  HEPARINUNFRC  --   < >  --  0.77* 0.61 0.51  CREATININE 1.13  --  1.13  --   --   --   TROPONINI <0.30  --   --   --   --   --   < > = values in this interval not displayed.  Estimated Creatinine Clearance: 39.6 ml/min (by C-G formula based on Cr of 1.13).  Assessment: 89 YOM on Coumadin + Heparin drip for bilateral PE. Overlap day #3 of 5 minimum.  INR up to 1.38 after only 2 doses which could indicated warfarin sensitivity. Pt is mildly anemic with stable Hgb and platelets. No bleeding noted. Patient had issues with losing his IV access overnight but running well now.  Heparin drip 1100 uts/hr. HL therapeutic at 0.51 this am.   Goal of Therapy:  INR 2-3 Heparin level 0.3-0.7 units/ml Monitor platelets by anticoagulation protocol: Yes   Plan:  Continue Heparin drip 1100 uts/hr  Daily CBC, PT/INR, and HL. Aim for higher end of the goal range given bilateral PE. Repeat Coumadin 5 mg x 1 dose today   Vinnie LevelBenjamin Chidinma Clites, PharmD.  Clinical Pharmacist Pager [phone]

## 2014-06-10 NOTE — Progress Notes (Signed)
*  PRELIMINARY RESULTS* Vascular Ultrasound Lower extremity venous duplex has been completed.  Preliminary findings: Right = evidence of partial DVT involving the gastroc veins. Appears subacute. Left = Partial acute DVT involving the common femoral vein. Occlusive acute DVT involving the femoral, profunda femoral, and popliteal veins. Superficial thrombosis of the GSV and LSV.  Farrel DemarkJill Eunice, RDMS, RVT  06/10/2014, 10:10 AM

## 2014-06-11 DIAGNOSIS — I5032 Chronic diastolic (congestive) heart failure: Secondary | ICD-10-CM

## 2014-06-11 LAB — URINALYSIS, ROUTINE W REFLEX MICROSCOPIC
BILIRUBIN URINE: NEGATIVE
Glucose, UA: NEGATIVE mg/dL
Ketones, ur: NEGATIVE mg/dL
LEUKOCYTES UA: NEGATIVE
NITRITE: NEGATIVE
Protein, ur: NEGATIVE mg/dL
SPECIFIC GRAVITY, URINE: 1.011 (ref 1.005–1.030)
Urobilinogen, UA: 0.2 mg/dL (ref 0.0–1.0)
pH: 5 (ref 5.0–8.0)

## 2014-06-11 LAB — URINE MICROSCOPIC-ADD ON

## 2014-06-11 LAB — CBC
HEMATOCRIT: 31.3 % — AB (ref 39.0–52.0)
Hemoglobin: 10.6 g/dL — ABNORMAL LOW (ref 13.0–17.0)
MCH: 29.9 pg (ref 26.0–34.0)
MCHC: 33.9 g/dL (ref 30.0–36.0)
MCV: 88.4 fL (ref 78.0–100.0)
Platelets: 243 10*3/uL (ref 150–400)
RBC: 3.54 MIL/uL — ABNORMAL LOW (ref 4.22–5.81)
RDW: 14.3 % (ref 11.5–15.5)
WBC: 6.5 10*3/uL (ref 4.0–10.5)

## 2014-06-11 LAB — HEPARIN LEVEL (UNFRACTIONATED)
Heparin Unfractionated: 0.76 IU/mL — ABNORMAL HIGH (ref 0.30–0.70)
Heparin Unfractionated: 0.33 IU/mL (ref 0.30–0.70)
Heparin Unfractionated: 0.46 IU/mL (ref 0.30–0.70)

## 2014-06-11 LAB — PROTIME-INR
INR: 2.1 — ABNORMAL HIGH (ref 0.00–1.49)
Prothrombin Time: 23.7 seconds — ABNORMAL HIGH (ref 11.6–15.2)

## 2014-06-11 MED ORDER — HEPARIN (PORCINE) IN NACL 100-0.45 UNIT/ML-% IJ SOLN: 950.0000 [IU]/h | INTRAMUSCULAR | Status: DC

## 2014-06-11 MED ORDER — WARFARIN SODIUM 2.5 MG PO TABS: 2.5000 mg | ORAL_TABLET | Freq: Every day | ORAL | Status: DC

## 2014-06-11 MED ORDER — WARFARIN SODIUM 2.5 MG PO TABS
2.5000 mg | ORAL_TABLET | Freq: Once | ORAL | Status: AC
  Administered 2014-06-11: 2.5 mg via ORAL
  Filled 2014-06-11: qty 1

## 2014-06-11 NOTE — Plan of Care (Signed)
Problem: Consults Goal: Diagnosis - Venous Thromboembolism (VTE) Choose a selection  Outcome: Completed/Met Date Met:  06/11/14 PE (Pulmonary Embolism)

## 2014-06-11 NOTE — Progress Notes (Signed)
ANTICOAGULATION CONSULT NOTE - Follow Up Consult  Pharmacy Consult for Heparin, Coumadin Indication: pulmonary embolus, bilateral  No Known Allergies  Patient Measurements: Height: 5' 10.87" (180 cm) Weight: 183 lb 3.2 oz (83.1 kg) IBW/kg (Calculated) : 74.99 Heparin Dosing Weight: 88 kg  Labs:  Recent Labs  06/08/14 1325  06/09/14 0329  06/09/14 1658 06/10/14 0332 06/11/14 0504  HGB 10.9*  --  10.5*  --   --  10.5* 10.6*  HCT 33.6*  --  31.3*  --   --  31.2* 31.3*  PLT 255  --  237  --   --  227 243  APTT 32  --   --   --   --   --   --   LABPROT 14.0  --  15.3*  --   --  17.1* 23.7*  INR 1.07  --  1.20  --   --  1.38 2.10*  HEPARINUNFRC  --   < >  --   < > 0.61 0.51 0.76*  CREATININE 1.13  --  1.13  --   --   --   --   TROPONINI <0.30  --   --   --   --   --   --   < > = values in this interval not displayed.  Estimated Creatinine Clearance: 39.6 ml/min (by C-G formula based on Cr of 1.13).  Assessment: 89 YOM on Coumadin + Heparin drip for bilateral PE. Overlap day #4 of 5 minimum.  INR up to 2.1 after only 3 doses which could indicated warfarin sensitivity. Pt is mildly anemic with stable Hgb and platelets. No bleeding noted.  Heparin drip 1100 uts/hr. HL slightly supratherapeutic at 0.76 this am.   Goal of Therapy:  INR 2-3 Heparin level 0.3-0.7 units/ml Monitor platelets by anticoagulation protocol: Yes   Plan:  Decrease IV heparin to 950 units/hr. Recheck heparin level in 8 hrs. Daily CBC, PT/INR, and HL. Aim for higher end of the goal range given bilateral PE. Coumadin 2.5 mg x 1.  Tad MooreJessica Hurley Sobel, Pharm D, BCPS  Clinical Pharmacist Pager [phone]  06/11/2014 7:28 AM

## 2014-06-11 NOTE — Evaluation (Signed)
Physical Therapy Evaluation Patient Details Name: Travis Becker MRN: [name] DOB: 1915/05/11 Today's Date: 06/11/2014   History of Present Illness  Patient is a 80 yo male admitted 06/08/14 with SOB and Bil. PE's.  PMH:  dementia, GERD, headaches, low back pain, CKD.  Clinical Impression  Patient presents with general weakness and decreased cognition impacting functional mobility. Will benefit from acute PT to maximize independence prior to discharge home with 24 hour assist.  Patient was active, going to gym until 4 weeks ago.  Recommend HHPT to continue therapy at discharge.    Follow Up Recommendations Home health PT;Supervision/Assistance - 24 hour    Equipment Recommendations  None recommended by PT    Recommendations for Other Services       Precautions / Restrictions Precautions Precautions: Fall Precaution Comments: Functional decline over past several weeks Restrictions Weight Bearing Restrictions: No      Mobility  Bed Mobility Overal bed mobility: Needs Assistance Bed Mobility: Supine to Sit;Sit to Supine     Supine to sit: Min guard Sit to supine: Min guard   General bed mobility comments: Assist for safety only. No physical assist needed.  Transfers Overall transfer level: Needs assistance Equipment used: Rolling walker (2 wheeled) Transfers: Sit to/from Stand Sit to Stand: Mod assist         General transfer comment: Verbal cues for hand placement and safety.  Assist to rise to standing and for balance.  Ambulation/Gait Ambulation/Gait assistance: Min assist Ambulation Distance (Feet): 3 Feet (3' forward and 3' backward) Assistive device: Rolling walker (2 wheeled) Gait Pattern/deviations: Step-through pattern;Decreased step length - right;Decreased step length - left;Decreased stride length;Shuffle;Trunk flexed;Steppage Gait velocity: Slow Gait velocity interpretation: Below normal speed for age/gender General Gait Details: Verbal cues for  safe use of RW. Assist for balance during gait.  Noted steppage with LLE.  Stairs            Wheelchair Mobility    Modified Rankin (Stroke Patients Only)       Balance Overall balance assessment: Needs assistance         Standing balance support: Bilateral upper extremity supported Standing balance-Leahy Scale: Poor                               Pertinent Vitals/Pain Pain Assessment: No/denies pain    Home Living Family/patient expects to be discharged to:: Private residence Living Arrangements: Alone Available Help at Discharge: Personal care attendant;Available 24 hours/day Type of Home: House Home Access: Stairs to enter Entrance Stairs-Rails: Doctor, general practiceight;Left Entrance Stairs-Number of Steps: 2 Home Layout: One level Home Equipment: Cane - single point;Walker - 2 wheels;Bedside commode;Shower seat      Prior Function Level of Independence: Independent with assistive device(s);Needs assistance   Gait / Transfers Assistance Needed: Uses cane outside of home.  Recently required assist with ambulation.  ADL's / Homemaking Assistance Needed: Aides assist with bathing, dressing, meal prep, housekeeping.        Hand Dominance        Extremity/Trunk Assessment   Upper Extremity Assessment: Overall WFL for tasks assessed           Lower Extremity Assessment: Generalized weakness;LLE deficits/detail         Communication   Communication: HOH  Cognition Arousal/Alertness: Awake/alert Behavior During Therapy: Anxious Overall Cognitive Status: History of cognitive impairments - at baseline (Oriented to person only)       Memory: Decreased short-term memory  General Comments      Exercises        Assessment/Plan    PT Assessment Patient needs continued PT services  PT Diagnosis Difficulty walking;Generalized weakness;Abnormality of gait;Altered mental status   PT Problem List Decreased strength;Decreased activity  tolerance;Decreased balance;Decreased mobility;Decreased cognition;Decreased knowledge of use of DME;Decreased safety awareness;Impaired sensation  PT Treatment Interventions DME instruction;Gait training;Functional mobility training;Therapeutic activities;Balance training;Cognitive remediation;Patient/family education   PT Goals (Current goals can be found in the Care Plan section) Acute Rehab PT Goals Patient Stated Goal: To get stronger PT Goal Formulation: With patient Time For Goal Achievement: 06/18/14 Potential to Achieve Goals: Good    Frequency Min 3X/week   Barriers to discharge        Co-evaluation               End of Session Equipment Utilized During Treatment: Gait belt Activity Tolerance: Patient limited by fatigue Patient left: in bed;with call bell/phone within reach;with bed alarm set;with nursing/sitter in room (Aide from home with patient) Nurse Communication: Mobility status         Time: [id number] PT Time Calculation (min): 28 min   Charges:   PT Evaluation $Initial PT Evaluation Tier I: 1 Procedure PT Treatments $Gait Training: 8-22 mins $Therapeutic Activity: 8-22 mins   PT G Codes:          Vena AustriaDavis, Neviah Braud H 06/11/2014, 5:29 PM Durenda HurtSusan H. Renaldo Fiddleravis, PT, [facility] CenterMBA Acute Rehab Services Pager [phone]

## 2014-06-11 NOTE — Progress Notes (Signed)
ANTICOAGULATION CONSULT NOTE - Follow Up Consult  Pharmacy Consult for Heparin Indication: Bilateral PE  No Known Allergies  Patient Measurements: Height: 5' 10.87" (180 cm) Weight: 183 lb 3.2 oz (83.1 kg) IBW/kg (Calculated) : 74.99 Heparin Dosing Weight: 83.1 kh  Vital Signs: Temp: 98.1 F (36.7 C) (10/18 0655) Temp Source: Oral (10/18 0655) BP: 152/67 mmHg (10/18 0655) Pulse Rate: 73 (10/18 0655)  Labs:  Recent Labs  06/09/14 0329  06/10/14 0332 06/11/14 0504 06/11/14 1540  HGB 10.5*  --  10.5* 10.6*  --   HCT 31.3*  --  31.2* 31.3*  --   PLT 237  --  227 243  --   LABPROT 15.3*  --  17.1* 23.7*  --   INR 1.20  --  1.38 2.10*  --   HEPARINUNFRC  --   < > 0.51 0.76* 0.33  CREATININE 1.13  --   --   --   --   < > = values in this interval not displayed.  Estimated Creatinine Clearance: 39.6 ml/min (by C-G formula based on Cr of 1.13).   Medications:  Heparin @ 950 units/hr  Assessment: 89 YOM who continues on heparin + warfarin overlap for new bilateral PE. VTE overlap D#4/5. Heparin level this afternoon is therapeutic after a rate decrease earlier today (HL 0.33 << 0.76, goal of 0.3-0.7). No bleeding noted. Will continue at current rate and recheck another level to confirm.   Goal of Therapy:  Heparin level 0.3-0.7 units/ml Monitor platelets by anticoagulation protocol: Yes   Plan:  1. Continue heparin at 950 units/hr (9.5 ml/hr) 2. Will continue to monitor for any signs/symptoms of bleeding and will follow up with heparin level in 8 hours to confirm therapeutic  Georgina PillionElizabeth Jun Osment, PharmD, BCPS Clinical Pharmacist Pager: [phone] 06/11/2014 4:35 PM

## 2014-06-11 NOTE — Discharge Summary (Signed)
Discharge Summary  Freda JacksonCharles E Hoopes WUJ:[id number] DOB: [birth year]  PCP: Lenora BoysFRIED, ROBERT L, MD  Admit date: 06/08/2014 Anticipated Discharge date: 06/12/2014  Time spent: 25 minutes  Recommendations for Outpatient Follow-up:  1. New medication: Coumadin 2.5 mg by mouth daily 2. Patient will follow up with his PCP Dr. Marinda Elkobert Fried this week to repeat INR check  Discharge Diagnoses:  Active Hospital Problems   Diagnosis Date Noted  . Pulmonary embolism 06/08/2014  . Chronic diastolic heart failure 06/11/2014  . CKD (chronic kidney disease) stage 3, GFR 30-59 ml/min 06/09/2014  . Senile dementia uncomp 06/09/2014  . GERD (gastroesophageal reflux disease) 06/09/2014    Resolved Hospital Problems   Diagnosis Date Noted Date Resolved  No resolved problems to display.    Discharge Condition: Improved, being discharged home  Diet recommendation: Heart healthy  Filed Weights   06/09/14 0500 06/10/14 0458 06/11/14 0655  Weight: 83.099 kg (183 lb 3.2 oz) 83.19 kg (183 lb 6.4 oz) 83.1 kg (183 lb 3.2 oz)    History of present illness:  80 year old male past medical history of mild dementia and acid reflux who is normally quite active, but has had decreased mobility over the past 2 months was noted to be hypoxic and referred for CT angiogram on 10/15 which was noted positive for large bilateral pulmonary emboli and patient was admitted to hospitalist service.  Hospital Course:  Principal Problem:   Pulmonary embolism: Stable. Echocardiogram checked and patient was noted to have grade 1 diastolic dysfunction. The setting of the acute PE, isn't clear if this is undiagnosed diastolic heart failure versus some strain secondary to pulmonary embolus. Would recommend rechecking echocardiogram in 6-12 months. By 10/18, INR therapeutic and by 10/19, patient had 5 days of overlap of heparin plus Coumadin and felt to be stable for discharge. Active Problems:   CKD (chronic kidney disease) stage 3,  GFR 30-59 ml/min: Because of concerns for creatinine clearance, patient felt to not be good candidate for new anticoagulation agents. Started on heparin and Coumadin   Senile dementia uncomp: Stable. Patient remained pleasant without any behavioral health disturbances. He does not have any long-term memory   GERD (gastroesophageal reflux disease): Continue PPI   Chronic diastolic heart failure: See above   Procedures:  Echocardiogram done 10/16: Grade 1 diastolic dysfunction  Consultations:  None  Discharge Exam: BP 152/67  Pulse 73  Temp(Src) 98.1 F (36.7 C) (Oral)  Resp 18  Ht 5' 10.87" (1.8 m)  Wt 83.1 kg (183 lb 3.2 oz)  BMI 25.65 kg/m2  SpO2 100%  General: Alert and oriented x2, no acute distress Cardiovascular: Regular rate and rhythm, S1-S2 Respiratory: Clear to auscultation bilaterally  Discharge Instructions You were cared for by a hospitalist during your hospital stay. If you have any questions about your discharge medications or the care you received while you were in the hospital after you are discharged, you can call the unit and asked to speak with the hospitalist on call if the hospitalist that took care of you is not available. Once you are discharged, your primary care physician will handle any further medical issues. Please note that NO REFILLS for any discharge medications will be authorized once you are discharged, as it is imperative that you return to your primary care physician (or establish a relationship with a primary care physician if you do not have one) for your aftercare needs so that they can reassess your need for medications and monitor your lab values.  Medication List         acetaminophen 325 MG tablet  Commonly known as:  TYLENOL  Take 650 mg by mouth at bedtime.     busPIRone 7.5 MG tablet  Commonly known as:  BUSPAR  Take 1 tablet by mouth 3 (three) times daily.     donepezil 10 MG tablet  Commonly known as:  ARICEPT  Take 10  mg by mouth daily.     famotidine 20 MG tablet  Commonly known as:  PEPCID  Take 20 mg by mouth at bedtime.     multivitamin with minerals tablet  Take 1 tablet by mouth daily.     NAMENDA XR 28 MG Cp24  Generic drug:  Memantine HCl ER  Take 1 tablet by mouth daily.     OSTEO BI-FLEX JOINT SHIELD Tabs  Take 1 tablet by mouth daily.     pantoprazole 40 MG tablet  Commonly known as:  PROTONIX  Take 1 tablet (40 mg total) by mouth daily. Take 30-60 min before first meal of the day     PROAIR HFA 108 (90 BASE) MCG/ACT inhaler  Generic drug:  albuterol  Inhale 2 puffs into the lungs every 4 (four) hours as needed (for shortness of breath).     warfarin 2.5 MG tablet  Commonly known as:  COUMADIN  Take 1 tablet (2.5 mg total) by mouth daily.       No Known Allergies    The results of significant diagnostics from this hospitalization (including imaging, microbiology, ancillary and laboratory) are listed below for reference.    Significant Diagnostic Studies: Ct Head Wo Contrast  06/09/2014   CLINICAL DATA:  Acute headache.  EXAM: CT HEAD WITHOUT CONTRAST  TECHNIQUE: Contiguous axial images were obtained from the base of the skull through the vertex without intravenous contrast.  COMPARISON:  None.  FINDINGS: Bony calvarium appears intact. Mild diffuse cortical atrophy is noted. Mild chronic ischemic white matter disease is noted. No mass effect or midline shift is noted. Ventricular size is within normal limits. There is no evidence of mass lesion, hemorrhage or acute infarction.  IMPRESSION: Mild diffuse cortical atrophy. Mild chronic ischemic white matter disease. No acute intracranial abnormality seen.   Electronically Signed   By: Roque Lias M.D.   On: 06/09/2014 13:24   Ct Angio Chest Pe W/cm &/or Wo Cm  06/08/2014   CLINICAL DATA:  Left ankle swelling and shortness of breath over 3 months. Evaluate for pulmonary embolism.  EXAM: CT ANGIOGRAPHY CHEST WITH CONTRAST   TECHNIQUE: Multidetector CT imaging of the chest was performed using the standard protocol during bolus administration of intravenous contrast. Multiplanar CT image reconstructions and MIPs were obtained to evaluate the vascular anatomy.  CONTRAST:  80mL OMNIPAQUE IOHEXOL 350 MG/ML SOLN  COMPARISON:  Chest radiograph 04/25/2014  FINDINGS: Study is positive for pulmonary emboli bilaterally. There is thrombus within segmental branches of the lower lobes bilaterally. Some of these occluded branches are smaller than the adjacent opacified vessels suggesting subacute or chronic thrombus. Question peripheral nonocclusive thrombus in the left main pulmonary artery but this could be artifactual. The RV/LV ratio is roughly 0.9. No significant bowing of the interventricular septum.  There is no significant chest lymphadenopathy. Atherosclerotic calcifications in the coronary arteries. No significant dilatation of the thoracic aorta and no evidence for a dissection. Images of the upper abdomen are unremarkable.  The trachea and mainstem bronchi are patent. There is an irregular streaky opacity in the right upper  lung with small areas of cavitation. This area roughly measures 2.7 x 4.0 x 3.7 cm. This could be related to scarring but a neoplastic or inflammatory process cannot be excluded. There is a smaller spiculated density along the periphery of the right upper lobe measuring 0.7 cm on sequence 6, image 20. There are additional patchy and streaky densities in the anterior right lung which are nonspecific. Peripheral patchy densities in the superior segment of the left lower lobe are nonspecific but there is a nodular area in the posterior left lower lobe that measures 1.2 cm on sequence 6, image 42. There is a tiny amount of left pleural fluid. No acute bone abnormality.  Review of the MIP images confirms the above findings.  IMPRESSION: Study is positive for bilateral pulmonary emboli. This may represent subacute clot but  it is age indeterminate.  There is an irregular parenchymal lesion in the right upper lung with areas of cavitation. This lesion is nonspecific. This could be related to infection, scar or even neoplasm. Comparison examinations would be helpful to evaluate for stability. There are additional scattered peripheral densities in both lungs, particularly in the superior segment of the left lower lobe which are nonspecific.  Critical Value/emergent results were called by telephone at the time of interpretation on 06/08/2014 at 12:09 pm to Dr. Sandrea HughsMICHAEL WERT , who verbally acknowledged these results.   Electronically Signed   By: Richarda OverlieAdam  Henn M.D.   On: 06/08/2014 12:15    Microbiology: No results found for this or any previous visit (from the past 240 hour(s)).   Labs: Basic Metabolic Panel:  Recent Labs Lab 06/08/14 1325 06/09/14 0329  NA 139 141  K 4.6 4.3  CL 106 107  CO2 23 23  GLUCOSE 96 83  BUN 21 21  CREATININE 1.13 1.13  CALCIUM 8.7 8.4   Liver Function Tests:  Recent Labs Lab 06/09/14 0329  AST 30  ALT 15  ALKPHOS 73  BILITOT 0.4  PROT 6.2  ALBUMIN 2.8*   No results found for this basename: LIPASE, AMYLASE,  in the last 168 hours No results found for this basename: AMMONIA,  in the last 168 hours CBC:  Recent Labs Lab 06/08/14 1325 06/09/14 0329 06/10/14 0332 06/11/14 0504  WBC 8.3 6.9 7.0 6.5  HGB 10.9* 10.5* 10.5* 10.6*  HCT 33.6* 31.3* 31.2* 31.3*  MCV 91.1 89.7 89.4 88.4  PLT 255 237 227 243   Cardiac Enzymes:  Recent Labs Lab 06/08/14 1325  TROPONINI <0.30   BNP: BNP (last 3 results)  Recent Labs  05/26/14 1509  PROBNP 73.0   CBG:  Recent Labs Lab 06/09/14 1118 06/09/14 1647 06/09/14 2133  GLUCAP 86 97 107*       Signed:  Tenasia Aull K  [facility] 06/11/2014, 4:51 PM

## 2014-06-12 LAB — CBC
HEMATOCRIT: 34.8 % — AB (ref 39.0–52.0)
Hemoglobin: 11.8 g/dL — ABNORMAL LOW (ref 13.0–17.0)
MCH: 30.3 pg (ref 26.0–34.0)
MCHC: 33.9 g/dL (ref 30.0–36.0)
MCV: 89.2 fL (ref 78.0–100.0)
Platelets: 257 10*3/uL (ref 150–400)
RBC: 3.9 MIL/uL — ABNORMAL LOW (ref 4.22–5.81)
RDW: 14.4 % (ref 11.5–15.5)
WBC: 6.7 10*3/uL (ref 4.0–10.5)

## 2014-06-12 LAB — PROTIME-INR
INR: 2.2 — ABNORMAL HIGH (ref 0.00–1.49)
Prothrombin Time: 24.6 seconds — ABNORMAL HIGH (ref 11.6–15.2)

## 2014-06-12 LAB — HEPARIN LEVEL (UNFRACTIONATED): HEPARIN UNFRACTIONATED: 0.51 [IU]/mL (ref 0.30–0.70)

## 2014-06-12 NOTE — Progress Notes (Signed)
Pt having frequent urination and sitters complaining of foul odor. Travis Becker. Callahan NP notified and orders placed for UA CNS sent.

## 2014-06-12 NOTE — Discharge Summary (Signed)
Discharge Summary  Travis JacksonCharles E Abelson WUJ:[id number] DOB: 1915/03/24  PCP: Lenora BoysFRIED, ROBERT L, MD  Admit date: 06/08/2014 Anticipated Discharge date: 06/12/2014  Time spent: 25 minutes  Recommendations for Outpatient Follow-up:  1. New medication: Coumadin 2.5 mg by mouth daily Patient will follow up with his PCP Dr. Marinda Elkobert Fried on Wednesday 10/21 at 11am for INR check.  Discharge Diagnoses:  Active Hospital Problems   Diagnosis Date Noted  . Pulmonary embolism 06/08/2014  . Chronic diastolic heart failure 06/11/2014  . CKD (chronic kidney disease) stage 3, GFR 30-59 ml/min 06/09/2014  . Senile dementia uncomp 06/09/2014  . GERD (gastroesophageal reflux disease) 06/09/2014    Resolved Hospital Problems   Diagnosis Date Noted Date Resolved  No resolved problems to display.    Discharge Condition: Improved, being discharged home  Diet recommendation: Heart healthy  Filed Weights   06/10/14 0458 06/11/14 0655 06/12/14 0510  Weight: 83.19 kg (183 lb 6.4 oz) 83.1 kg (183 lb 3.2 oz) 83 kg (182 lb 15.7 oz)    History of present illness:  80 year old male past medical history of mild dementia and acid reflux who is normally quite active, but has had decreased mobility over the past 2 months was noted to be hypoxic and referred for CT angiogram on 10/15 which was noted positive for large bilateral pulmonary emboli and patient was admitted to hospitalist service.  Hospital Course:  Principal Problem:   Pulmonary embolism: Stable. Echocardiogram checked and patient was noted to have grade 1 diastolic dysfunction. The setting of the acute PE, isn't clear if this is undiagnosed diastolic heart failure versus some strain secondary to pulmonary embolus. Would recommend rechecking echocardiogram in 6-12 months. By 10/18, INR therapeutic and by 10/19, patient had 5 days of overlap of heparin plus Coumadin and felt to be stable for discharge. Active Problems:   CKD (chronic kidney disease)  stage 3, GFR 30-59 ml/min: Because of concerns for creatinine clearance, patient felt to not be good candidate for new anticoagulation agents. Started on heparin and Coumadin   Senile dementia uncomp: Stable. Patient remained pleasant without any behavioral health disturbances. He does not have any long-term memory   GERD (gastroesophageal reflux disease): Continue PPI   Chronic diastolic heart failure: See above   Procedures:  Echocardiogram done 10/16: Grade 1 diastolic dysfunction  Consultations:  None  Discharge Exam: BP 159/64  Pulse 68  Temp(Src) 97.8 F (36.6 C) (Oral)  Resp 18  Ht 5' 10.87" (1.8 m)  Wt 83 kg (182 lb 15.7 oz)  BMI 25.62 kg/m2  SpO2 98%  General: Alert and oriented x2, no acute distress Cardiovascular: Regular rate and rhythm, S1-S2 Respiratory: Clear to auscultation bilaterally  Discharge Instructions You were cared for by a hospitalist during your hospital stay. If you have any questions about your discharge medications or the care you received while you were in the hospital after you are discharged, you can call the unit and asked to speak with the hospitalist on call if the hospitalist that took care of you is not available. Once you are discharged, your primary care physician will handle any further medical issues. Please note that NO REFILLS for any discharge medications will be authorized once you are discharged, as it is imperative that you return to your primary care physician (or establish a relationship with a primary care physician if you do not have one) for your aftercare needs so that they can reassess your need for medications and monitor your lab values.  Medication List         acetaminophen 325 MG tablet  Commonly known as:  TYLENOL  Take 650 mg by mouth at bedtime.     busPIRone 7.5 MG tablet  Commonly known as:  BUSPAR  Take 1 tablet by mouth 3 (three) times daily.     donepezil 10 MG tablet  Commonly known as:  ARICEPT    Take 10 mg by mouth daily.     famotidine 20 MG tablet  Commonly known as:  PEPCID  Take 20 mg by mouth at bedtime.     multivitamin with minerals tablet  Take 1 tablet by mouth daily.     NAMENDA XR 28 MG Cp24  Generic drug:  Memantine HCl ER  Take 1 tablet by mouth daily.     OSTEO BI-FLEX JOINT SHIELD Tabs  Take 1 tablet by mouth daily.     pantoprazole 40 MG tablet  Commonly known as:  PROTONIX  Take 1 tablet (40 mg total) by mouth daily. Take 30-60 min before first meal of the day     PROAIR HFA 108 (90 BASE) MCG/ACT inhaler  Generic drug:  albuterol  Inhale 2 puffs into the lungs every 4 (four) hours as needed (for shortness of breath).     warfarin 2.5 MG tablet  Commonly known as:  COUMADIN  Take 1 tablet (2.5 mg total) by mouth daily.       No Known Allergies Follow-up Information   Follow up with Lenora BoysFRIED, ROBERT L, MD On 06/14/2014. (Thies Wednesday at 11AM for Coumadin level check)    Specialty:  Family Medicine   Contact information:   [address] [place] [id number] [phone]        The results of significant diagnostics from this hospitalization (including imaging, microbiology, ancillary and laboratory) are listed below for reference.    Significant Diagnostic Studies: Ct Head Wo Contrast  06/09/2014   CLINICAL DATA:  Acute headache.  EXAM: CT HEAD WITHOUT CONTRAST  TECHNIQUE: Contiguous axial images were obtained from the base of the skull through the vertex without intravenous contrast.  COMPARISON:  None.  FINDINGS: Bony calvarium appears intact. Mild diffuse cortical atrophy is noted. Mild chronic ischemic white matter disease is noted. No mass effect or midline shift is noted. Ventricular size is within normal limits. There is no evidence of mass lesion, hemorrhage or acute infarction.  IMPRESSION: Mild diffuse cortical atrophy. Mild chronic ischemic white matter disease. No acute intracranial abnormality seen.   Electronically Signed   By:  Roque LiasJames  Green M.D.   On: 06/09/2014 13:24   Ct Angio Chest Pe W/cm &/or Wo Cm  06/08/2014   CLINICAL DATA:  Left ankle swelling and shortness of breath over 3 months. Evaluate for pulmonary embolism.  EXAM: CT ANGIOGRAPHY CHEST WITH CONTRAST  TECHNIQUE: Multidetector CT imaging of the chest was performed using the standard protocol during bolus administration of intravenous contrast. Multiplanar CT image reconstructions and MIPs were obtained to evaluate the vascular anatomy.  CONTRAST:  80mL OMNIPAQUE IOHEXOL 350 MG/ML SOLN  COMPARISON:  Chest radiograph 04/25/2014  FINDINGS: Study is positive for pulmonary emboli bilaterally. There is thrombus within segmental branches of the lower lobes bilaterally. Some of these occluded branches are smaller than the adjacent opacified vessels suggesting subacute or chronic thrombus. Question peripheral nonocclusive thrombus in the left main pulmonary artery but this could be artifactual. The RV/LV ratio is roughly 0.9. No significant bowing of the interventricular septum.  There is  no significant chest lymphadenopathy. Atherosclerotic calcifications in the coronary arteries. No significant dilatation of the thoracic aorta and no evidence for a dissection. Images of the upper abdomen are unremarkable.  The trachea and mainstem bronchi are patent. There is an irregular streaky opacity in the right upper lung with small areas of cavitation. This area roughly measures 2.7 x 4.0 x 3.7 cm. This could be related to scarring but a neoplastic or inflammatory process cannot be excluded. There is a smaller spiculated density along the periphery of the right upper lobe measuring 0.7 cm on sequence 6, image 20. There are additional patchy and streaky densities in the anterior right lung which are nonspecific. Peripheral patchy densities in the superior segment of the left lower lobe are nonspecific but there is a nodular area in the posterior left lower lobe that measures 1.2 cm on  sequence 6, image 42. There is a tiny amount of left pleural fluid. No acute bone abnormality.  Review of the MIP images confirms the above findings.  IMPRESSION: Study is positive for bilateral pulmonary emboli. This may represent subacute clot but it is age indeterminate.  There is an irregular parenchymal lesion in the right upper lung with areas of cavitation. This lesion is nonspecific. This could be related to infection, scar or even neoplasm. Comparison examinations would be helpful to evaluate for stability. There are additional scattered peripheral densities in both lungs, particularly in the superior segment of the left lower lobe which are nonspecific.  Critical Value/emergent results were called by telephone at the time of interpretation on 06/08/2014 at 12:09 pm to Dr. Sandrea Hughs , who verbally acknowledged these results.   Electronically Signed   By: Richarda Overlie M.D.   On: 06/08/2014 12:15    Microbiology: No results found for this or any previous visit (from the past 240 hour(s)).   Labs: Basic Metabolic Panel:  Recent Labs Lab 06/08/14 1325 06/09/14 0329  NA 139 141  K 4.6 4.3  CL 106 107  CO2 23 23  GLUCOSE 96 83  BUN 21 21  CREATININE 1.13 1.13  CALCIUM 8.7 8.4   Liver Function Tests:  Recent Labs Lab 06/09/14 0329  AST 30  ALT 15  ALKPHOS 73  BILITOT 0.4  PROT 6.2  ALBUMIN 2.8*   No results found for this basename: LIPASE, AMYLASE,  in the last 168 hours No results found for this basename: AMMONIA,  in the last 168 hours CBC:  Recent Labs Lab 06/08/14 1325 06/09/14 0329 06/10/14 0332 06/11/14 0504 06/12/14 0507  WBC 8.3 6.9 7.0 6.5 6.7  HGB 10.9* 10.5* 10.5* 10.6* 11.8*  HCT 33.6* 31.3* 31.2* 31.3* 34.8*  MCV 91.1 89.7 89.4 88.4 89.2  PLT 255 237 227 243 257   Cardiac Enzymes:  Recent Labs Lab 06/08/14 1325  TROPONINI <0.30   BNP: BNP (last 3 results)  Recent Labs  05/26/14 1509  PROBNP 73.0   CBG:  Recent Labs Lab  06/09/14 1118 06/09/14 1647 06/09/14 2133  GLUCAP 86 97 107*       Signed:  Julyssa Kyer K  [facility] 06/12/2014, 10:38 AM

## 2014-06-12 NOTE — Progress Notes (Signed)
ANTICOAGULATION CONSULT NOTE Pharmacy Consult for Heparin Indication: Bilateral PE  No Known Allergies  Patient Measurements: Height: 5' 10.87" (180 cm) Weight: 183 lb 3.2 oz (83.1 kg) IBW/kg (Calculated) : 74.99 Heparin Dosing Weight: 83.1 kh  Vital Signs: Temp: 98.1 F (36.7 C) (10/18 2049) Temp Source: Oral (10/18 1400) BP: 105/53 mmHg (10/18 2049) Pulse Rate: 63 (10/18 2049)  Labs:  Recent Labs  06/09/14 0329  06/10/14 0332 06/11/14 0504 06/11/14 1540 06/11/14 2305  HGB 10.5*  --  10.5* 10.6*  --   --   HCT 31.3*  --  31.2* 31.3*  --   --   PLT 237  --  227 243  --   --   LABPROT 15.3*  --  17.1* 23.7*  --   --   INR 1.20  --  1.38 2.10*  --   --   HEPARINUNFRC  --   < > 0.51 0.76* 0.33 0.46  CREATININE 1.13  --   --   --   --   --   < > = values in this interval not displayed.  Estimated Creatinine Clearance: 39.6 ml/min (by C-G formula based on Cr of 1.13).  Assessment: [age] YOM with new bilateral PE for heparin  Goal of Therapy:  Heparin level 0.3-0.7 units/ml Monitor platelets by anticoagulation protocol: Yes   Plan:  Continue Heparin at current rate Follow-up am labs.  Geannie RisenGreg River Mckercher, PharmD, BCPS   06/12/2014 12:13 AM

## 2014-06-12 NOTE — Progress Notes (Signed)
Physical Therapy Treatment Patient Details Name: Travis JacksonCharles E Becker MRN: [id number] DOB: 01-05-1915 Today's Date: 06/12/2014    History of Present Illness Patient is a 80 yo male admitted 06/08/14 with SOB and Bil. PE's.  PMH:  dementia, GERD, headaches, low back pain, CKD.    PT Comments    Patient progressing well with mobility. Tolerated improvement in ambulation distance with Min A for balance as pt unsteady on feet. Able to maintain Sa02 in safe ranges on RA. Pt fatigues quickly. Recommend hands on assist and use of RW for all mobility at home. Education provided to patient and family present in room. Will continue to follow and progress as tolerated.   Follow Up Recommendations  Home health PT;Supervision/Assistance - 24 hour     Equipment Recommendations  None recommended by PT    Recommendations for Other Services       Precautions / Restrictions Precautions Precautions: Fall Precaution Comments: Functional decline over past several weeks Restrictions Weight Bearing Restrictions: No    Mobility  Bed Mobility Overal bed mobility: Needs Assistance Bed Mobility: Supine to Sit     Supine to sit: Min guard;HOB elevated     General bed mobility comments: Assist for safety only. No physical assist needed. Use of rails.  Transfers Overall transfer level: Needs assistance Equipment used: Rolling walker (2 wheeled) Transfers: Sit to/from Stand Sit to Stand: Min guard         General transfer comment: Stood from EOB x2. VC for hand placement and safety. Min guard for balance. Transferred to chair with constant verbal cues for positioning and to reach back for chair.  Ambulation/Gait Ambulation/Gait assistance: Min assist Ambulation Distance (Feet): 120 Feet Assistive device: Rolling walker (2 wheeled) Gait Pattern/deviations: Step-through pattern;Decreased stride length;Trunk flexed   Gait velocity interpretation: Below normal speed for age/gender General Gait  Details: VC for upright posture, RW management. Min A for balance during gait. Ambulated on RA. Sa02 stayed >92% on RA. Mild dyspnea present.   Stairs            Wheelchair Mobility    Modified Rankin (Stroke Patients Only)       Balance Overall balance assessment: Needs assistance   Sitting balance-Leahy Scale: Fair Sitting balance - Comments: Able to sit EOB unsupported, total A to donn socks, shoes which is baseline per daughter. Cannot tolerate challenge.   Standing balance support: During functional activity;Bilateral upper extremity supported Standing balance-Leahy Scale: Poor Standing balance comment: Require BUE support on RW for balance/safety.                    Cognition Arousal/Alertness: Awake/alert Behavior During Therapy: WFL for tasks assessed/performed Overall Cognitive Status: History of cognitive impairments - at baseline       Memory: Decreased short-term memory              Exercises General Exercises - Lower Extremity Ankle Circles/Pumps: Both;15 reps;Seated Long Arc Quad: Both;10 reps;Seated    General Comments General comments (skin integrity, edema, etc.): Sa02 ranged 92-97% on RA. 02 donned at end of session as daughter reports pt wears 02 at home.      Pertinent Vitals/Pain Pain Assessment: No/denies pain    Home Living                      Prior Function            PT Goals (current goals can now be found in the care plan section)  Progress towards PT goals: Progressing toward goals    Frequency  Min 3X/week    PT Plan Current plan remains appropriate    Co-evaluation             End of Session Equipment Utilized During Treatment: Gait belt Activity Tolerance: Patient tolerated treatment well Patient left: in chair;with call bell/phone within reach;with family/visitor present     Time: 0931-1000 PT Time Calculation (min): 29 min  Charges:  $Gait Training: 8-22 mins $Therapeutic Activity:  8-22 mins                    G CodesAlvie Heidelberg:      Folan, Euretha Najarro A 06/12/2014, 10:31 AM Alvie HeidelbergShauna Folan, PT, DPT [phone]

## 2014-06-13 LAB — URINE CULTURE
COLONY COUNT: NO GROWTH
Culture: NO GROWTH

## 2015-02-23 ENCOUNTER — Encounter (HOSPITAL_COMMUNITY): Payer: Self-pay

## 2015-02-23 ENCOUNTER — Emergency Department (HOSPITAL_COMMUNITY)
Admission: EM | Admit: 2015-02-23 | Discharge: 2015-02-23 | Disposition: A | Discharge status: Home / Self Care | Payer: Medicare Other | Attending: Emergency Medicine | Admitting: Emergency Medicine

## 2015-02-23 ENCOUNTER — Emergency Department (HOSPITAL_COMMUNITY): Payer: Medicare Other

## 2015-02-23 DIAGNOSIS — F0390 Unspecified dementia without behavioral disturbance: Secondary | ICD-10-CM | POA: Diagnosis not present

## 2015-02-23 DIAGNOSIS — Z86711 Personal history of pulmonary embolism: Secondary | ICD-10-CM | POA: Insufficient documentation

## 2015-02-23 DIAGNOSIS — Z87891 Personal history of nicotine dependence: Secondary | ICD-10-CM | POA: Diagnosis not present

## 2015-02-23 DIAGNOSIS — R400 Somnolence: Secondary | ICD-10-CM

## 2015-02-23 DIAGNOSIS — K219 Gastro-esophageal reflux disease without esophagitis: Secondary | ICD-10-CM | POA: Diagnosis not present

## 2015-02-23 DIAGNOSIS — R0602 Shortness of breath: Secondary | ICD-10-CM | POA: Diagnosis present

## 2015-02-23 DIAGNOSIS — Z8739 Personal history of other diseases of the musculoskeletal system and connective tissue: Secondary | ICD-10-CM | POA: Insufficient documentation

## 2015-02-23 DIAGNOSIS — Z79899 Other long term (current) drug therapy: Secondary | ICD-10-CM | POA: Diagnosis not present

## 2015-02-23 HISTORY — DX: Unspecified dementia, unspecified severity, without behavioral disturbance, psychotic disturbance, mood disturbance, and anxiety: F03.90

## 2015-02-23 LAB — CBC
HCT: 36.2 % — ABNORMAL LOW (ref 39.0–52.0)
Hemoglobin: 12.3 g/dL — ABNORMAL LOW (ref 13.0–17.0)
MCH: 31 pg (ref 26.0–34.0)
MCHC: 34 g/dL (ref 30.0–36.0)
MCV: 91.2 fL (ref 78.0–100.0)
Platelets: 258 10*3/uL (ref 150–400)
RBC: 3.97 MIL/uL — ABNORMAL LOW (ref 4.22–5.81)
RDW: 13.5 % (ref 11.5–15.5)
WBC: 7.7 10*3/uL (ref 4.0–10.5)

## 2015-02-23 LAB — COMPREHENSIVE METABOLIC PANEL
ALK PHOS: 71 U/L (ref 38–126)
ALT: 15 U/L — ABNORMAL LOW (ref 17–63)
AST: 28 U/L (ref 15–41)
Albumin: 3.1 g/dL — ABNORMAL LOW (ref 3.5–5.0)
Anion gap: 8 (ref 5–15)
BILIRUBIN TOTAL: 0.6 mg/dL (ref 0.3–1.2)
BUN: 21 mg/dL — ABNORMAL HIGH (ref 6–20)
CALCIUM: 8.7 mg/dL — AB (ref 8.9–10.3)
CO2: 23 mmol/L (ref 22–32)
Chloride: 108 mmol/L (ref 101–111)
Creatinine, Ser: 1.31 mg/dL — ABNORMAL HIGH (ref 0.61–1.24)
GFR calc Af Amer: 51 mL/min — ABNORMAL LOW (ref >60)
GFR, EST NON AFRICAN AMERICAN: 44 mL/min — AB (ref >60)
Glucose, Bld: 125 mg/dL — ABNORMAL HIGH (ref 65–99)
Potassium: 4.2 mmol/L (ref 3.5–5.1)
Sodium: 139 mmol/L (ref 135–145)
Total Protein: 6.5 g/dL (ref 6.5–8.1)

## 2015-02-23 LAB — URINALYSIS, ROUTINE W REFLEX MICROSCOPIC
Bilirubin Urine: NEGATIVE
GLUCOSE, UA: NEGATIVE mg/dL
Hgb urine dipstick: NEGATIVE
KETONES UR: 15 mg/dL — AB
Leukocytes, UA: NEGATIVE
Nitrite: NEGATIVE
PROTEIN: NEGATIVE mg/dL
SPECIFIC GRAVITY, URINE: 1.023 (ref 1.005–1.030)
UROBILINOGEN UA: 0.2 mg/dL (ref 0.0–1.0)
pH: 5 (ref 5.0–8.0)

## 2015-02-23 LAB — I-STAT TROPONIN, ED: TROPONIN I, POC: 0.01 ng/mL (ref 0.00–0.08)

## 2015-02-23 LAB — PROTIME-INR
INR: 1.13 (ref 0.00–1.49)
Prothrombin Time: 14.7 seconds (ref 11.6–15.2)

## 2015-02-23 LAB — GRAM STAIN

## 2015-02-23 NOTE — Discharge Instructions (Signed)

## 2015-02-23 NOTE — ED Provider Notes (Signed)
CSN: [id number]     Arrival date & time 02/23/15  1516 History   First MD Initiated Contact with Patient 02/23/15 1519     Chief Complaint  Patient presents with  . Shortness of Breath    (Consider location/radiation/quality/duration/timing/severity/associated sxs/prior Treatment) Patient is a 81 y.o. male presenting with altered mental status. The history is provided by a relative.  Altered Mental Status Presenting symptoms: confusion, lethargy and unresponsiveness   Severity:  Mild Most recent episode:  Today Episode history:  Single Timing:  Constant Progression:  Resolved Chronicity:  New Context: dementia   Context: taking medications as prescribed, not a recent change in medication, not a recent illness and not a recent infection   Associated symptoms: no abdominal pain, no bladder incontinence, no decreased appetite, no difficulty breathing, no fever, no headaches, no light-headedness, no nausea, no slurred speech, no vomiting and no weakness     Past Medical History  Diagnosis Date  . Chronic headaches   . PE (pulmonary embolism) 06/08/2014  . Shortness of breath   . GERD (gastroesophageal reflux disease)   . Tremors of nervous system   . Arthritis     osteo  . Dementia    Past Surgical History  Procedure Laterality Date  . Total knee arthroplasty Right    Family History  Problem Relation Age of Onset  . Stroke Father    History  Substance Use Topics  . Smoking status: Former Smoker -- 1.00 packs/day for 50 years    Types: Cigarettes    Quit date: 08/25/1970  . Smokeless tobacco: Never Used  . Alcohol Use: No    Review of Systems  Constitutional: Negative for fever, diaphoresis, fatigue and decreased appetite.  Respiratory: Negative for chest tightness and shortness of breath.   Cardiovascular: Negative for chest pain.  Gastrointestinal: Negative for nausea, vomiting and abdominal pain.  Genitourinary: Negative for bladder incontinence, dysuria,  frequency and difficulty urinating.  Neurological: Negative for weakness, light-headedness and headaches.  Psychiatric/Behavioral: Positive for confusion.  All other systems reviewed and are negative.     Allergies  Review of patient's allergies indicates no known allergies.  Home Medications   Prior to Admission medications   Medication Sig Start Date End Date Taking? Authorizing Provider  acetaminophen (TYLENOL) 325 MG tablet Take 650 mg by mouth daily as needed for mild pain.    Yes Historical Provider, MD  busPIRone (BUSPAR) 7.5 MG tablet Take 1 tablet by mouth 3 (three) times daily. 05/02/14  Yes Historical Provider, MD  donepezil (ARICEPT) 10 MG tablet Take 10 mg by mouth daily.  04/15/14  Yes Historical Provider, MD  famotidine (PEPCID) 20 MG tablet Take 20 mg by mouth at bedtime.   Yes Historical Provider, MD  loratadine (CLARITIN) 10 MG tablet Take 10 mg by mouth daily.   Yes Historical Provider, MD  Misc Natural Products (OSTEO BI-FLEX JOINT SHIELD) TABS Take 1 tablet by mouth daily.   Yes Historical Provider, MD  Multiple Vitamins-Minerals (MULTIVITAMIN WITH MINERALS) tablet Take 1 tablet by mouth daily.   Yes Historical Provider, MD  NAMENDA XR 28 MG CP24 Take 1 tablet by mouth daily. 05/02/14  Yes Historical Provider, MD  XARELTO 15 MG TABS tablet Take 15 mg by mouth daily at 6 PM. 02/20/15  Yes Historical Provider, MD  pantoprazole (PROTONIX) 40 MG tablet Take 1 tablet (40 mg total) by mouth daily. Take 30-60 min before first meal of the day Patient not taking: Reported on 02/23/2015 05/26/14  Nyoka Cowden, MD  PROAIR HFA 108 (90 BASE) MCG/ACT inhaler Inhale 2 puffs into the lungs every 4 (four) hours as needed (for shortness of breath).  04/27/14   Historical Provider, MD  warfarin (COUMADIN) 2.5 MG tablet Take 1 tablet (2.5 mg total) by mouth daily. Patient not taking: Reported on 02/23/2015 06/11/14   Hollice Espy, MD   BP 133/65 mmHg  Pulse 57  Temp(Src) 97.8 F (36.6 C)  (Oral)  Resp 11  SpO2 95% Physical Exam  Constitutional: He appears well-developed and well-nourished. No distress.  HENT:  Head: Normocephalic and atraumatic.  Nose: Nose normal.  Mouth/Throat: Oropharynx is clear and moist. No oropharyngeal exudate.  Eyes: EOM are normal. Pupils are equal, round, and reactive to light.  Neck: Normal range of motion. Neck supple.  Cardiovascular: Normal rate, regular rhythm, normal heart sounds and intact distal pulses.   No murmur heard. Pulmonary/Chest: Effort normal and breath sounds normal. No respiratory distress. He has no wheezes. He exhibits no tenderness.  Abdominal: Soft. He exhibits no distension. There is no tenderness. There is no guarding.  Musculoskeletal: Normal range of motion. He exhibits no tenderness.  Neurological: He is alert. No cranial nerve deficit. Coordination normal.  Awake and alert but not oriented.  Full strength of bilateral upper and lower extremities, full sensation throughout, no cranial nerve deficits except for decreased hearing.     Skin: Skin is warm and dry. He is not diaphoretic. No pallor.  Psychiatric: He has a normal mood and affect. His behavior is normal.  Nursing note and vitals reviewed.   ED Course  Procedures (including critical care time) Labs Review Labs Reviewed  CBC - Abnormal; Notable for the following:    RBC 3.97 (*)    Hemoglobin 12.3 (*)    HCT 36.2 (*)    All other components within normal limits  COMPREHENSIVE METABOLIC PANEL - Abnormal; Notable for the following:    Glucose, Bld 125 (*)    BUN 21 (*)    Creatinine, Ser 1.31 (*)    Calcium 8.7 (*)    Albumin 3.1 (*)    ALT 15 (*)    GFR calc non Af Amer 44 (*)    GFR calc Af Amer 51 (*)    All other components within normal limits  URINALYSIS, ROUTINE W REFLEX MICROSCOPIC (NOT AT [facility]) - Abnormal; Notable for the following:    Color, Urine AMBER (*)    Ketones, ur 15 (*)    All other components within normal limits  GRAM  STAIN  URINE CULTURE  PROTIME-INR  I-STAT TROPOININ, ED    Imaging Review Dg Chest 2 View  02/23/2015   CLINICAL DATA:  Confusion and weakness.  EXAM: CHEST  2 VIEW  COMPARISON:  06/08/2014.  FINDINGS: Low lung volumes. Elevation of the LEFT hemidiaphragm with LEFT basilar atelectasis. No airspace disease. No effusion. Patient rotated to the RIGHT.  IMPRESSION: No active cardiopulmonary disease.   Electronically Signed   By: Andreas Newport M.D.   On: 02/23/2015 16:28     EKG Interpretation   Date/Time:  Friday February 23 2015 15:36:21 EDT Ventricular Rate:  68 PR Interval:  201 QRS Duration: 158 QT Interval:  460 QTC Calculation: 489 R Axis:   48 Text Interpretation:  Sinus rhythm Left bundle branch block Sinus rhythm  Left bundle branch block T wave abnormality Abnormal ekg Confirmed by  Gerhard Munch  MD ([id number]) on 02/23/2015 4:00:18 PM  MDM   Final diagnoses:  Dementia, without behavioral disturbance  Somnolence   Pt is a 81 yo M with hx of dementia and PE (on xarelto) who presents with his family 2/2 somnolence.  He has 24 hour care givers and limited higher level functioning 2/2 dementia.  The RN had difficulty arousing him from his nap this afternoon, despite trying to stimulate him for approximately 1 hour.  He eventually woke up but was still somewhat groggy so family called 911.  They deny noting any recent illness, no cough, vomiting, diarrhea, or change in bathroom habits.  No hx of recurrent UTIs.  No recent falls.  Was with a care giver all day until the nap then he was found still in his bed at onset of sx, so doubt that he had head trauma.    Awake and alert, communicating freely now.  Disoriented but can follow conversation.  Limited higher level cognition.  Son reports he is back at his baseline mental status at this point.  Exam benign.  Lungs clear. Abd soft.  No rashes.  Full strength of bilateral upper and lower extremities, full sensation throughout, no  cranial nerve deficits except for decreased hearing.    Glucose WNL by EMS  Will work up with general screening labs, EKG, and CXR to evaluate for underlying infectious etiology, anemia, electrolyte disturbances, or cardiac pathology that could have caused his somnolence earlier.   Labs returned reassuring.  Mild AKI, with Cr 1.3 up from baseline Cr 1.1-1.2.  BUN 20.   EKG with known left bundle branch block, unchanged from previous.   CXR with no signs of pneumonia or acute cardiopulmonary pathology.  UA with no signs of infection.   Advised to stay well hydrated.  Encouraged increased PO intake for the next few days to prevent dehydration.  Encouraged close PCP follow up early next week.  Believe that patient's transient somnolence was likely secondary to his chronic severe dementia.    If performed, labs, EKGs, and imaging were reviewed and interpreted by myself and my attending, and incorporated in medical decision making.  Patient was seen with ED Attending, Dr. Derrick RavelLockwood  Edynn Gillock, MD   Lenell AntuJamie Zinedine Ellner, MD 02/24/15 [id number]  Gerhard Munchobert Lockwood, MD 02/27/15 [id number]

## 2015-02-23 NOTE — ED Notes (Signed)
Per EMS, Patient's family reports patient having increase altered mental status since this morning. Patient took shower today and got tired. Patient laid down and when family tried to wake him up for lunch and medications, patient was hard to arouse and keep awake. Family stated it took them thirty minutes to wake up patient. During transport, EMS found new onset of left bundle branch block. Patient denies any complaints. Patient has no Hx of Stroke or Cardiac related. Hx of PE, Dementia, and DVT. Patient reports hypoxia on exertion with home oxygen needed. Vitals per EMS: 136/72, 70 HR NSR, 20 RR, Clear, 100% on 3L, 140 CBG.

## 2015-02-25 LAB — URINE CULTURE

## 2016-01-22 ENCOUNTER — Emergency Department (HOSPITAL_COMMUNITY): Payer: Medicare Other

## 2016-01-22 ENCOUNTER — Observation Stay (HOSPITAL_COMMUNITY): Payer: Medicare Other

## 2016-01-22 ENCOUNTER — Observation Stay (HOSPITAL_COMMUNITY)
Admission: EM | Admit: 2016-01-22 | Discharge: 2016-01-26 | Disposition: A | Discharge status: Home / Self Care | Payer: Medicare Other | Attending: Internal Medicine | Admitting: Internal Medicine

## 2016-01-22 ENCOUNTER — Encounter (HOSPITAL_COMMUNITY): Payer: Self-pay | Admitting: Emergency Medicine

## 2016-01-22 DIAGNOSIS — N183 Chronic kidney disease, stage 3 unspecified: Secondary | ICD-10-CM

## 2016-01-22 DIAGNOSIS — N179 Acute kidney failure, unspecified: Secondary | ICD-10-CM | POA: Diagnosis not present

## 2016-01-22 DIAGNOSIS — E860 Dehydration: Secondary | ICD-10-CM | POA: Diagnosis not present

## 2016-01-22 DIAGNOSIS — N189 Chronic kidney disease, unspecified: Secondary | ICD-10-CM | POA: Diagnosis not present

## 2016-01-22 DIAGNOSIS — M545 Low back pain, unspecified: Secondary | ICD-10-CM

## 2016-01-22 DIAGNOSIS — F0390 Unspecified dementia without behavioral disturbance: Secondary | ICD-10-CM | POA: Diagnosis not present

## 2016-01-22 DIAGNOSIS — K219 Gastro-esophageal reflux disease without esophagitis: Secondary | ICD-10-CM | POA: Insufficient documentation

## 2016-01-22 DIAGNOSIS — M549 Dorsalgia, unspecified: Secondary | ICD-10-CM | POA: Diagnosis present

## 2016-01-22 DIAGNOSIS — Z7901 Long term (current) use of anticoagulants: Secondary | ICD-10-CM | POA: Diagnosis not present

## 2016-01-22 DIAGNOSIS — M1990 Unspecified osteoarthritis, unspecified site: Secondary | ICD-10-CM | POA: Insufficient documentation

## 2016-01-22 DIAGNOSIS — R52 Pain, unspecified: Secondary | ICD-10-CM

## 2016-01-22 DIAGNOSIS — S32020D Wedge compression fracture of second lumbar vertebra, subsequent encounter for fracture with routine healing: Secondary | ICD-10-CM

## 2016-01-22 DIAGNOSIS — Z86711 Personal history of pulmonary embolism: Secondary | ICD-10-CM | POA: Insufficient documentation

## 2016-01-22 DIAGNOSIS — Z87891 Personal history of nicotine dependence: Secondary | ICD-10-CM | POA: Diagnosis not present

## 2016-01-22 DIAGNOSIS — G8929 Other chronic pain: Secondary | ICD-10-CM

## 2016-01-22 DIAGNOSIS — M5440 Lumbago with sciatica, unspecified side: Secondary | ICD-10-CM | POA: Diagnosis not present

## 2016-01-22 DIAGNOSIS — Z79899 Other long term (current) drug therapy: Secondary | ICD-10-CM | POA: Insufficient documentation

## 2016-01-22 LAB — CBC
HEMATOCRIT: 39 % (ref 39.0–52.0)
Hemoglobin: 13.2 g/dL (ref 13.0–17.0)
MCH: 29.9 pg (ref 26.0–34.0)
MCHC: 33.8 g/dL (ref 30.0–36.0)
MCV: 88.4 fL (ref 78.0–100.0)
Platelets: 351 10*3/uL (ref 150–400)
RBC: 4.41 MIL/uL (ref 4.22–5.81)
RDW: 13.8 % (ref 11.5–15.5)
WBC: 12.6 10*3/uL — AB (ref 4.0–10.5)

## 2016-01-22 LAB — URINALYSIS, ROUTINE W REFLEX MICROSCOPIC
Glucose, UA: NEGATIVE mg/dL
HGB URINE DIPSTICK: NEGATIVE
Ketones, ur: 15 mg/dL — AB
LEUKOCYTES UA: NEGATIVE
Nitrite: NEGATIVE
PROTEIN: NEGATIVE mg/dL
SPECIFIC GRAVITY, URINE: 1.025 (ref 1.005–1.030)
pH: 5 (ref 5.0–8.0)

## 2016-01-22 LAB — BASIC METABOLIC PANEL
ANION GAP: 12 (ref 5–15)
BUN: 25 mg/dL — AB (ref 6–20)
CHLORIDE: 100 mmol/L — AB (ref 101–111)
CO2: 21 mmol/L — ABNORMAL LOW (ref 22–32)
Calcium: 9.2 mg/dL (ref 8.9–10.3)
Creatinine, Ser: 1.76 mg/dL — ABNORMAL HIGH (ref 0.61–1.24)
GFR calc Af Amer: 35 mL/min — ABNORMAL LOW (ref >60)
GFR, EST NON AFRICAN AMERICAN: 30 mL/min — AB (ref >60)
Glucose, Bld: 120 mg/dL — ABNORMAL HIGH (ref 65–99)
POTASSIUM: 4.6 mmol/L (ref 3.5–5.1)
Sodium: 133 mmol/L — ABNORMAL LOW (ref 135–145)

## 2016-01-22 LAB — PROTIME-INR
INR: 1.12 (ref 0.00–1.49)
Prothrombin Time: 14.6 seconds (ref 11.6–15.2)

## 2016-01-22 LAB — CBG MONITORING, ED: GLUCOSE-CAPILLARY: 103 mg/dL — AB (ref 65–99)

## 2016-01-22 MED ORDER — LORATADINE 10 MG PO TABS
10.0000 mg | ORAL_TABLET | Freq: Every day | ORAL | Status: DC
  Administered 2016-01-23 – 2016-01-25 (×3): 10 mg via ORAL
  Filled 2016-01-22 (×4): qty 1

## 2016-01-22 MED ORDER — ONDANSETRON HCL 4 MG/2ML IJ SOLN: 4.0000 mg | Freq: Four times a day (QID) | INTRAMUSCULAR | Status: DC | PRN

## 2016-01-22 MED ORDER — VANCOMYCIN HCL IN DEXTROSE 1-5 GM/200ML-% IV SOLN: 1000.0000 mg | INTRAVENOUS | Status: DC

## 2016-01-22 MED ORDER — ACETAMINOPHEN 325 MG PO TABS
650.0000 mg | ORAL_TABLET | Freq: Four times a day (QID) | ORAL | Status: DC | PRN
  Administered 2016-01-26: 650 mg via ORAL
  Filled 2016-01-22: qty 2

## 2016-01-22 MED ORDER — SODIUM CHLORIDE 0.9 % IV SOLN
INTRAVENOUS | Status: DC
  Administered 2016-01-22: 22:00:00 via INTRAVENOUS

## 2016-01-22 MED ORDER — FENTANYL CITRATE (PF) 100 MCG/2ML IJ SOLN
25.0000 ug | INTRAMUSCULAR | Status: DC | PRN
  Administered 2016-01-22: 25 ug via INTRAVENOUS
  Filled 2016-01-22: qty 2

## 2016-01-22 MED ORDER — ONDANSETRON HCL 4 MG PO TABS: 4.0000 mg | ORAL_TABLET | Freq: Four times a day (QID) | ORAL | Status: DC | PRN

## 2016-01-22 MED ORDER — MORPHINE SULFATE (PF) 2 MG/ML IV SOLN
2.0000 mg | Freq: Once | INTRAVENOUS | Status: AC
Start: 2016-01-22 — End: 2016-01-22
  Administered 2016-01-22: 2 mg via INTRAVENOUS
  Filled 2016-01-22: qty 1

## 2016-01-22 MED ORDER — VANCOMYCIN HCL 10 G IV SOLR
1500.0000 mg | Freq: Once | INTRAVENOUS | Status: AC
  Administered 2016-01-23: 1500 mg via INTRAVENOUS
  Filled 2016-01-22: qty 1500

## 2016-01-22 MED ORDER — DIPHENHYDRAMINE HCL 25 MG PO CAPS
25.0000 mg | ORAL_CAPSULE | Freq: Once | ORAL | Status: AC
  Administered 2016-01-22: 25 mg via ORAL
  Filled 2016-01-22: qty 1

## 2016-01-22 MED ORDER — DEXAMETHASONE SODIUM PHOSPHATE 10 MG/ML IJ SOLN
10.0000 mg | Freq: Once | INTRAMUSCULAR | Status: AC
  Administered 2016-01-22: 10 mg via INTRAVENOUS
  Filled 2016-01-22: qty 2.5
  Filled 2016-01-22: qty 1

## 2016-01-22 MED ORDER — DONEPEZIL HCL 10 MG PO TABS
10.0000 mg | ORAL_TABLET | Freq: Every day | ORAL | Status: DC
  Administered 2016-01-23 – 2016-01-26 (×4): 10 mg via ORAL
  Filled 2016-01-22 (×5): qty 1

## 2016-01-22 MED ORDER — FAMOTIDINE 20 MG PO TABS
20.0000 mg | ORAL_TABLET | Freq: Every day | ORAL | Status: DC
  Administered 2016-01-22 – 2016-01-25 (×4): 20 mg via ORAL
  Filled 2016-01-22 (×4): qty 1

## 2016-01-22 MED ORDER — ACETAMINOPHEN 650 MG RE SUPP: 650.0000 mg | Freq: Four times a day (QID) | RECTAL | Status: DC | PRN

## 2016-01-22 MED ORDER — MEMANTINE HCL ER 28 MG PO CP24
28.0000 mg | ORAL_CAPSULE | Freq: Every day | ORAL | Status: DC
  Administered 2016-01-23 – 2016-01-26 (×4): 28 mg via ORAL
  Filled 2016-01-22 (×4): qty 1

## 2016-01-22 MED ORDER — BUSPIRONE HCL 15 MG PO TABS
7.5000 mg | ORAL_TABLET | Freq: Three times a day (TID) | ORAL | Status: DC
  Administered 2016-01-22 – 2016-01-26 (×11): 7.5 mg via ORAL
  Filled 2016-01-22 (×12): qty 1

## 2016-01-22 MED ORDER — SODIUM CHLORIDE 0.9 % IV SOLN
INTRAVENOUS | Status: AC
  Administered 2016-01-22 – 2016-01-23 (×2): via INTRAVENOUS
  Administered 2016-01-23: 125 mL/h via INTRAVENOUS

## 2016-01-22 NOTE — ED Notes (Signed)
Pt's CBG result was 103. Informed Alvino ChapelEllen - RN.

## 2016-01-22 NOTE — H&P (Addendum)
History and Physical    TANDRE CONLY ZOX:[id number] DOB: [birth year] DOA: 01/22/2016  PCP: Jerral Ralph, MD  Patient coming from: Home.  History obtained from patient's son as patient is demented.  Chief Complaint: Low back pain and lower extremity weakness.  HPI: Travis Becker is a 82 y.o. male with medical history significant of dementia was brought to the ER the patient was found to have increasing low back pain radiating to right lower extremity with weakness. Patient's son states patient has had sudden onset of low back pain last week and 7 days ago and was taken to the primary care physician and x-rays done at the facility showed possible compression fracture. Patient was treated with Tylenol No. 3 and tramadol. Despite which patient's pain was worsening. Over the last 2 days patient's pain worsened and the patient's leg start giving way today and patient found it difficult to walk. In the ER CT of the thoracic spine and lumbar spine was done which shows L2 endplate fracture with surrounding edema differentials also include discitis. Patient is afebrile. Patient's family states patient did not have any fall and symptoms started suddenly last week when patient was trying to sit up. Patient is usually walking with the help of a walker and generally walks minimal and lies on the lazy boy. Patient's son states he has chronic incontinence of urine. Denies any fever or chills. On exam patient has weakness in the right lower extremity strength is around 2 x 5 with normal strength of the left lower extremity. On trying to move his right lower extremity patient has significant pain.   ED Course: CT thoracic spine and lumbar spine was done.  Review of Systems: As per HPI otherwise 10 point review of systems negative.    Past Medical History  Diagnosis Date  . Chronic headaches   . PE (pulmonary embolism) 06/08/2014  . Shortness of breath   . GERD (gastroesophageal reflux disease)    . Tremors of nervous system   . Arthritis     osteo  . Dementia     Past Surgical History  Procedure Laterality Date  . Total knee arthroplasty Right      reports that he quit smoking about 45 years ago. His smoking use included Cigarettes. He has a 50 pack-year smoking history. He has never used smokeless tobacco. He reports that he does not drink alcohol or use illicit drugs.  No Known Allergies  Family History  Problem Relation Age of Onset  . Stroke Father     Prior to Admission medications   Medication Sig Start Date End Date Taking? Authorizing Provider  acetaminophen (TYLENOL) 325 MG tablet Take 650 mg by mouth daily as needed for mild pain.    Yes Historical Provider, MD  busPIRone (BUSPAR) 7.5 MG tablet Take 1 tablet by mouth 3 (three) times daily. 05/02/14  Yes Historical Provider, MD  diphenhydramine-acetaminophen (TYLENOL PM) 25-500 MG TABS tablet Take 1 tablet by mouth at bedtime as needed. For sleep   Yes Historical Provider, MD  donepezil (ARICEPT) 10 MG tablet Take 10 mg by mouth daily.  04/15/14  Yes Historical Provider, MD  famotidine (PEPCID) 20 MG tablet Take 20 mg by mouth at bedtime.   Yes Historical Provider, MD  loratadine (CLARITIN) 10 MG tablet Take 10 mg by mouth daily.   Yes Historical Provider, MD  Misc Natural Products (OSTEO BI-FLEX JOINT SHIELD) TABS Take 1 tablet by mouth daily.   Yes Historical Provider, MD  Multiple Vitamins-Minerals (MULTIVITAMIN WITH MINERALS) tablet Take 1 tablet by mouth daily.   Yes Historical Provider, MD  NAMENDA XR 28 MG CP24 Take 1 tablet by mouth daily. 05/02/14  Yes Historical Provider, MD  traMADol (ULTRAM) 50 MG tablet Take 50 mg by mouth every 6 (six) hours as needed for moderate pain.   Yes Historical Provider, MD  pantoprazole (PROTONIX) 40 MG tablet Take 1 tablet (40 mg total) by mouth daily. Take 30-60 min before first meal of the day Patient not taking: Reported on 02/23/2015 05/26/14   Nyoka CowdenMichael B Wert, MD  warfarin  (COUMADIN) 2.5 MG tablet Take 1 tablet (2.5 mg total) by mouth daily. Patient not taking: Reported on 02/23/2015 06/11/14   Hollice EspySendil K Krishnan, MD    Physical Exam: Filed Vitals:   01/22/16 2015 01/22/16 2030 01/22/16 2045 01/22/16 2214  BP: 119/57 112/61 108/59 116/54  Pulse: 81 81 83 81  Temp:    98.9 F (37.2 C)  TempSrc:    Axillary  Resp: 17 18 15 16   Weight:    183 lb 10.3 oz (83.3 kg)  SpO2: 94% 92% 94% 95%      Constitutional: Not in distress. Filed Vitals:   01/22/16 2015 01/22/16 2030 01/22/16 2045 01/22/16 2214  BP: 119/57 112/61 108/59 116/54  Pulse: 81 81 83 81  Temp:    98.9 F (37.2 C)  TempSrc:    Axillary  Resp: 17 18 15 16   Weight:    183 lb 10.3 oz (83.3 kg)  SpO2: 94% 92% 94% 95%   Eyes: Anicteric. No pallor. ENMT: No discharge from the ears eyes nose and mouth. Neck: No JVD appreciated no mass felt. No neck rigidity. Respiratory: No rhonchi or crepitations. Cardiovascular: S1-S2 heard. Abdomen: Soft nontender bowel sounds present. No guarding or rigidity. Musculoskeletal: No edema. Skin: No rash. Neurologic: Alert awake oriented to person. Patient has weakness in the right lower extremity with strength 2 x 5 with no hyperreflexia. Patient is able to move other extremities normally. Psychiatric: Patient has dementia and is oriented to his name.   Labs on Admission: I have personally reviewed following labs and imaging studies  CBC:  Recent Labs Lab 01/22/16 1711  WBC 12.6*  HGB 13.2  HCT 39.0  MCV 88.4  PLT 351   Basic Metabolic Panel:  Recent Labs Lab 01/22/16 1711  NA 133*  K 4.6  CL 100*  CO2 21*  GLUCOSE 120*  BUN 25*  CREATININE 1.76*  CALCIUM 9.2   GFR: CrCl cannot be calculated (Unknown ideal weight.). Liver Function Tests: No results for input(s): AST, ALT, ALKPHOS, BILITOT, PROT, ALBUMIN in the last 168 hours. No results for input(s): LIPASE, AMYLASE in the last 168 hours. No results for input(s): AMMONIA in the last  168 hours. Coagulation Profile:  Recent Labs Lab 01/22/16 1711  INR 1.12   Cardiac Enzymes: No results for input(s): CKTOTAL, CKMB, CKMBINDEX, TROPONINI in the last 168 hours. BNP (last 3 results) No results for input(s): PROBNP in the last 8760 hours. HbA1C: No results for input(s): HGBA1C in the last 72 hours. CBG:  Recent Labs Lab 01/22/16 2125  GLUCAP 103*   Lipid Profile: No results for input(s): CHOL, HDL, LDLCALC, TRIG, CHOLHDL, LDLDIRECT in the last 72 hours. Thyroid Function Tests: No results for input(s): TSH, T4TOTAL, FREET4, T3FREE, THYROIDAB in the last 72 hours. Anemia Panel: No results for input(s): VITAMINB12, FOLATE, FERRITIN, TIBC, IRON, RETICCTPCT in the last 72 hours. Urine analysis:    Component  Value Date/Time   COLORURINE AMBER* 01/22/2016 1812   APPEARANCEUR CLOUDY* 01/22/2016 1812   LABSPEC 1.025 01/22/2016 1812   PHURINE 5.0 01/22/2016 1812   GLUCOSEU NEGATIVE 01/22/2016 1812   HGBUR NEGATIVE 01/22/2016 1812   BILIRUBINUR SMALL* 01/22/2016 1812   KETONESUR 15* 01/22/2016 1812   PROTEINUR NEGATIVE 01/22/2016 1812   UROBILINOGEN 0.2 02/23/2015 1550   NITRITE NEGATIVE 01/22/2016 1812   LEUKOCYTESUR NEGATIVE 01/22/2016 1812   Sepsis Labs: (procalcitonin:4,lacticidven:4) )No results found for this or any previous visit (from the past 240 hour(s)).   Radiological Exams on Admission: Ct Thoracic Spine Wo Contrast  01/22/2016  CLINICAL DATA:  Compression fracture diagnosed last Monday, with increasing pain. Subsequent encounter. EXAM: CT THORACIC AND LUMBAR SPINE WITHOUT CONTRAST TECHNIQUE: Multidetector CT imaging of the thoracic and lumbar spine was performed without contrast. Multiplanar CT image reconstructions were also generated. COMPARISON:  Chest CT 06/08/2014 FINDINGS: CT THORACIC SPINE FINDINGS T1 and a portion of the T2 vertebra is excluded from view. Given distance from symptomatic level repeat imaging not performed. If this  area is of clinical interest will gladly repeat and addend. There is no evidence of thoracic spine fracture or traumatic malalignment. Bridging osteophytes with ankylosis from T5-T12. No gross herniation or high-grade canal stenosis. There is an angular ground-glass opacity in the right upper lobe without evidence of progression since 2015, stability favoring scar. CT LUMBAR SPINE FINDINGS There is mild paravertebral fat edema around the L1-2 disc space anteriorly and to the right. An osteophyte on the right is fragmented at this level, chronicity indeterminate. There is prominent subchondral erosive changes along the anterior disc space at this level, and early discitis is considered, although it would be unusual to be associated with vacuum phenomenon. There is subchondral erosive changes around the L3-4 disc space centrally, but no soft tissue inflammation. No posterior element fracture or subluxation. There is advanced diffuse degenerative disc and facet disease. No indication of high-grade canal stenosis or acute disc herniation. Foraminal narrowing greatest at L5-S1 bilaterally, mainly from disc narrowing and endplate spurs. Exophytic right renal cyst. IMPRESSION: CT THORACIC SPINE IMPRESSION 1. No acute finding. 2. T1 and T2 are excluded from view. CT LUMBAR SPINE IMPRESSION 1. L2 superior endplate osteophyte fracture or fragmentation, nondisplaced. Given endplate erosions around the subtle soft tissue edema at this level, please ensure no laboratory or clinical findings for discitis (presence of vacuum phenomenon makes this entity less likely by imaging). 2. L3-4 subchondral erosions without associated soft tissue inflammation. Electronically Signed   By: Marnee Spring M.D.   On: 01/22/2016 19:02   Ct Lumbar Spine Wo Contrast  01/22/2016  CLINICAL DATA:  Compression fracture diagnosed last Monday, with increasing pain. Subsequent encounter. EXAM: CT THORACIC AND LUMBAR SPINE WITHOUT CONTRAST TECHNIQUE:  Multidetector CT imaging of the thoracic and lumbar spine was performed without contrast. Multiplanar CT image reconstructions were also generated. COMPARISON:  Chest CT 06/08/2014 FINDINGS: CT THORACIC SPINE FINDINGS T1 and a portion of the T2 vertebra is excluded from view. Given distance from symptomatic level repeat imaging not performed. If this area is of clinical interest will gladly repeat and addend. There is no evidence of thoracic spine fracture or traumatic malalignment. Bridging osteophytes with ankylosis from T5-T12. No gross herniation or high-grade canal stenosis. There is an angular ground-glass opacity in the right upper lobe without evidence of progression since 2015, stability favoring scar. CT LUMBAR SPINE FINDINGS There is mild paravertebral fat edema around the L1-2 disc space anteriorly and to  the right. An osteophyte on the right is fragmented at this level, chronicity indeterminate. There is prominent subchondral erosive changes along the anterior disc space at this level, and early discitis is considered, although it would be unusual to be associated with vacuum phenomenon. There is subchondral erosive changes around the L3-4 disc space centrally, but no soft tissue inflammation. No posterior element fracture or subluxation. There is advanced diffuse degenerative disc and facet disease. No indication of high-grade canal stenosis or acute disc herniation. Foraminal narrowing greatest at L5-S1 bilaterally, mainly from disc narrowing and endplate spurs. Exophytic right renal cyst. IMPRESSION: CT THORACIC SPINE IMPRESSION 1. No acute finding. 2. T1 and T2 are excluded from view. CT LUMBAR SPINE IMPRESSION 1. L2 superior endplate osteophyte fracture or fragmentation, nondisplaced. Given endplate erosions around the subtle soft tissue edema at this level, please ensure no laboratory or clinical findings for discitis (presence of vacuum phenomenon makes this entity less likely by imaging). 2.  L3-4 subchondral erosions without associated soft tissue inflammation. Electronically Signed   By: Marnee Spring M.D.   On: 01/22/2016 19:02     Assessment/Plan Principal Problem:   Low back pain Active Problems:   Dementia   Renal failure (ARF), acute on chronic (HCC)    #1. Low back pain with compression fracture at L2 with surrounding edema with radiculopathy and right lower extremity weakness - patient's symptoms are most likely from the compression fracture and radiculopathy secondary to the surrounding edema. Among the differentials include discitis. For now I have placed patient on empiric vancomycin/Zosyn and obtain blood cultures and check sedimentation rate. I have given 1 dose of Decadron 10 mg for reducing edema and ordered stat MRI lumbar spine to further study. #2. Acute on chronic renal failure stage III with metabolic acidosis - probably secondary to poor oral intake as patient's son states that patient has not been eating well last 2 days. Denies any nausea vomiting or diarrhea. At this time and gently hydrating and closely follow metabolic panel intake and output. #3. History of dementia on Aricept and Namenda. #4. Previous history of PE off anticoagulants presently.  Addendum - MRI lumbosacral spine does show - Acute nondisplaced compression fracture involving the right superior aspect of L2 without significant depression with surrounding edema. Among the differentials include possibility of discitis. I have discussed with on-call neurologist Dr. Amada Jupiter.   Addendum - discussed with Dr. Bevely Palmer, on-call neurosurgeon, who reviewed patient's MRI and advised patient probably has a fragility fracture. Since sedimentation rate is elevated to consult infectious disease for empiric antibiotics. No role for surgical intervention or IR biopsy.  DVT prophylaxis: SCDs. Code Status: DO NOT RESUSCITATE.  Family Communication: Patient's son.  Disposition Plan: To be determined.   Consults called: Discussed with neurologist Dr. Amada Jupiter. Neurosurgeon Dr. Bevely Palmer. Admission status: Observation.    Eduard Clos MD [facility] Pager [phone].  If 7PM-7AM, please contact night-coverage [email or web address] Password [email or web address]  01/22/2016, 10:39 PM

## 2016-01-22 NOTE — ED Notes (Signed)
Pt to ER via [facility] accompanied with family with complaint of worsening back pain significantly over the last 18 hours. Pt diagnosed with compression fracture in back last Monday. Sent home from PCP with tramadol and tylenol for pain. Pt has been able to walk with walker until 18 hours ago in which patient pain was so severe he was no longer able to bear weight. VSS[facility]. Hx of dementia.

## 2016-01-22 NOTE — Progress Notes (Signed)
Admission note:  Arrival Method: Pt arrived on stretcher from ED Mental Orientation: Alert and oriented to person and situation Telemetry: N/A Assessment: Completed, see flowsheets Skin: Dry and intact. Assessed with Nancy MarusNikki Murphy, RN IV: Right hand. Site is clean, dry and intact.  Pain: Patient calls out in pain when having to be moved, but rests peacefully when lying in bed. Will monitor.  Tubes: N/A Safety Measures: Bed in lowest position, call light within reach, non-slip socks placed, family at bedside  Fall Prevention Safety Plan: Reviewed with patient and patient's family Admission Screening: Completed. Unable to completed the "Abuse/Neglect" portion at this time due to family being present at bedside and the patient being a poor historian.  6700 Orientation: Patient has been oriented to the unit, staff and to the room. Orders have been reviewed and implemented. Family is present at bedside, call light is within reach. Will continue to monitor the patient closely.  Feliciana RossettiLaura Alaster Asfaw, RN, BSN

## 2016-01-22 NOTE — Progress Notes (Signed)
Pharmacy Antibiotic Note  Freda JacksonCharles E Dunstan is a 82 y.o. male admitted on 01/22/2016 with back pain, found to have verterbral fracture and possible discitis/osteomyelitis.  Pharmacy has been consulted for Vancomycin dosing.  Plan: Vancomycin 1500 mg IV now, then 1 g IV q48h F/U MRI results  Weight: 183 lb 10.3 oz (83.3 kg)  Temp (24hrs), Avg:98.2 F (36.8 C), Min:97.4 F (36.3 C), Max:98.9 F (37.2 C)   Recent Labs Lab 01/22/16 1711  WBC 12.6*  CREATININE 1.76*    CrCl cannot be calculated (Unknown ideal weight.).    No Known Allergies  .  Eddie Candlebbott, Kham Zuckerman [place] 01/22/2016 11:01 PM

## 2016-01-22 NOTE — ED Provider Notes (Signed)
CSN: [id number]     Arrival date & time 01/22/16  1552 History   First MD Initiated Contact with Patient 01/22/16 1604     Chief Complaint  Patient presents with  . Back Pain     (Consider location/radiation/quality/duration/timing/severity/associated sxs/prior Treatment) The history is provided by the patient, medical records, a relative, a caregiver and the EMS personnel. No language interpreter was used.   Travis Becker is a 82 y.o. male  with a hx of Dementia, arthritis presents to the Emergency Department complaining of gradual, persistent, progressively worsening low and mid back pain onset greater than one week ago but worsening in the last 18 hours.   Level V caveat - dementia  Patient's son provides much of the history stating that patient was seen approximately one week ago at his primary care office [facility]  At that appointment plain films were obtained and patient was diagnosed with several compression fractures. Patient was discharged home with acetaminophen and tramadol. Patient's son and caregiver at bedside reports that he has been ambulatory throughout the week with a walker and assistance however in the last 18 hours he has stopped being able to bear weight on his legs due to severe back pain. Patient's caregiver also believes that his right leg has been more weak. Today when she attempted to help him stand she reports that his right leg "gave out." Patient did not fall because she was standing there. Family and caregiver deny any recent falls or known trauma.  He has been taking his acetaminophen and tramadol as directed.     Past Medical History  Diagnosis Date  . Chronic headaches   . PE (pulmonary embolism) 06/08/2014  . Shortness of breath   . GERD (gastroesophageal reflux disease)   . Tremors of nervous system   . Arthritis     osteo  . Dementia    Past Surgical History  Procedure Laterality Date  . Total knee arthroplasty Right    Family History   Problem Relation Age of Onset  . Stroke Father    Social History  Substance Use Topics  . Smoking status: Former Smoker -- 1.00 packs/day for 50 years    Types: Cigarettes    Quit date: 08/25/1970  . Smokeless tobacco: Never Used  . Alcohol Use: No    Review of Systems  Unable to perform ROS: Dementia  Musculoskeletal: Positive for back pain and gait problem ( 2/2 pain).      Allergies  Review of patient's allergies indicates no known allergies.  Home Medications   Prior to Admission medications   Medication Sig Start Date End Date Taking? Authorizing Provider  acetaminophen (TYLENOL) 325 MG tablet Take 650 mg by mouth daily as needed for mild pain.     Historical Provider, MD  busPIRone (BUSPAR) 7.5 MG tablet Take 1 tablet by mouth 3 (three) times daily. 05/02/14   Historical Provider, MD  donepezil (ARICEPT) 10 MG tablet Take 10 mg by mouth daily.  04/15/14   Historical Provider, MD  famotidine (PEPCID) 20 MG tablet Take 20 mg by mouth at bedtime.    Historical Provider, MD  loratadine (CLARITIN) 10 MG tablet Take 10 mg by mouth daily.    Historical Provider, MD  Misc Natural Products (OSTEO BI-FLEX JOINT SHIELD) TABS Take 1 tablet by mouth daily.    Historical Provider, MD  Multiple Vitamins-Minerals (MULTIVITAMIN WITH MINERALS) tablet Take 1 tablet by mouth daily.    Historical Provider, MD  NAMENDA XR 28 MG  CP24 Take 1 tablet by mouth daily. 05/02/14   Historical Provider, MD  pantoprazole (PROTONIX) 40 MG tablet Take 1 tablet (40 mg total) by mouth daily. Take 30-60 min before first meal of the day Patient not taking: Reported on 02/23/2015 05/26/14   Nyoka Cowden, MD  PROAIR HFA 108 (90 BASE) MCG/ACT inhaler Inhale 2 puffs into the lungs every 4 (four) hours as needed (for shortness of breath).  04/27/14   Historical Provider, MD  warfarin (COUMADIN) 2.5 MG tablet Take 1 tablet (2.5 mg total) by mouth daily. Patient not taking: Reported on 02/23/2015 06/11/14   Hollice Espy,  MD  XARELTO 15 MG TABS tablet Take 15 mg by mouth daily at 6 PM. 02/20/15   Historical Provider, MD   BP 116/60 mmHg  Pulse 84  Temp(Src) 97.4 F (36.3 C) (Oral)  Resp 17  SpO2 93% Physical Exam  Constitutional: He appears well-developed and well-nourished. No distress.  Awake, alert, nontoxic appearance  HENT:  Head: Normocephalic and atraumatic.  Mouth/Throat: Oropharynx is clear and moist. No oropharyngeal exudate.  Eyes: Conjunctivae are normal. No scleral icterus.  Neck: Normal range of motion. Neck supple.  Full ROM without pain  Cardiovascular: Normal rate, regular rhythm, normal heart sounds and intact distal pulses.   Pulmonary/Chest: Effort normal and breath sounds normal. No respiratory distress. He has no wheezes.  Equal chest expansion  Abdominal: Soft. Bowel sounds are normal. He exhibits no distension and no mass. There is no tenderness. There is no rebound and no guarding.  Musculoskeletal: Normal range of motion. He exhibits no edema.  Decreased range of motion of the T-spine and L-spine due to severe pain Diffuse tenderness to palpation of the spinous processes or of the T-spine and L-spine; no paraspinal tenderness  Lymphadenopathy:    He has no cervical adenopathy.  Neurological: He is alert.  Reflex Scores:      Bicep reflexes are 2+ on the right side and 2+ on the left side.      Brachioradialis reflexes are 2+ on the right side and 2+ on the left side.      Patellar reflexes are 2+ on the right side and 2+ on the left side.      Achilles reflexes are 2+ on the right side and 2+ on the left side. Speech is clear and goal oriented, follows commands 4/5 strength in upper and lower extremities bilaterally including dorsiflexion and plantar flexion, equal grip strength; pt able to lift left leg 4" off the bed and right leg only 1" Sensation normal to light touch Moves extremities without ataxia, coordination intact Unable to ambulate due to pain No Clonus    Skin: Skin is warm and dry. No rash noted. He is not diaphoretic. No erythema.  Psychiatric: He has a normal mood and affect. His behavior is normal.  Nursing note and vitals reviewed.   ED Course  Procedures (including critical care time) Labs Review Labs Reviewed  CBC - Abnormal; Notable for the following:    WBC 12.6 (*)    All other components within normal limits  BASIC METABOLIC PANEL - Abnormal; Notable for the following:    Sodium 133 (*)    Chloride 100 (*)    CO2 21 (*)    Glucose, Bld 120 (*)    BUN 25 (*)    Creatinine, Ser 1.76 (*)    GFR calc non Af Amer 30 (*)    GFR calc Af Amer 35 (*)  All other components within normal limits  URINALYSIS, ROUTINE W REFLEX MICROSCOPIC (NOT AT [facility]) - Abnormal; Notable for the following:    Color, Urine AMBER (*)    APPearance CLOUDY (*)    Bilirubin Urine SMALL (*)    Ketones, ur 15 (*)    All other components within normal limits  PROTIME-INR  CBG MONITORING, ED    Imaging Review Ct Thoracic Spine Wo Contrast  01/22/2016  CLINICAL DATA:  Compression fracture diagnosed last Monday, with increasing pain. Subsequent encounter. EXAM: CT THORACIC AND LUMBAR SPINE WITHOUT CONTRAST TECHNIQUE: Multidetector CT imaging of the thoracic and lumbar spine was performed without contrast. Multiplanar CT image reconstructions were also generated. COMPARISON:  Chest CT 06/08/2014 FINDINGS: CT THORACIC SPINE FINDINGS T1 and a portion of the T2 vertebra is excluded from view. Given distance from symptomatic level repeat imaging not performed. If this area is of clinical interest will gladly repeat and addend. There is no evidence of thoracic spine fracture or traumatic malalignment. Bridging osteophytes with ankylosis from T5-T12. No gross herniation or high-grade canal stenosis. There is an angular ground-glass opacity in the right upper lobe without evidence of progression since 2015, stability favoring scar. CT LUMBAR SPINE FINDINGS There is  mild paravertebral fat edema around the L1-2 disc space anteriorly and to the right. An osteophyte on the right is fragmented at this level, chronicity indeterminate. There is prominent subchondral erosive changes along the anterior disc space at this level, and early discitis is considered, although it would be unusual to be associated with vacuum phenomenon. There is subchondral erosive changes around the L3-4 disc space centrally, but no soft tissue inflammation. No posterior element fracture or subluxation. There is advanced diffuse degenerative disc and facet disease. No indication of high-grade canal stenosis or acute disc herniation. Foraminal narrowing greatest at L5-S1 bilaterally, mainly from disc narrowing and endplate spurs. Exophytic right renal cyst. IMPRESSION: CT THORACIC SPINE IMPRESSION 1. No acute finding. 2. T1 and T2 are excluded from view. CT LUMBAR SPINE IMPRESSION 1. L2 superior endplate osteophyte fracture or fragmentation, nondisplaced. Given endplate erosions around the subtle soft tissue edema at this level, please ensure no laboratory or clinical findings for discitis (presence of vacuum phenomenon makes this entity less likely by imaging). 2. L3-4 subchondral erosions without associated soft tissue inflammation. Electronically Signed   By: Marnee Spring M.D.   On: 01/22/2016 19:02   Ct Lumbar Spine Wo Contrast  01/22/2016  CLINICAL DATA:  Compression fracture diagnosed last Monday, with increasing pain. Subsequent encounter. EXAM: CT THORACIC AND LUMBAR SPINE WITHOUT CONTRAST TECHNIQUE: Multidetector CT imaging of the thoracic and lumbar spine was performed without contrast. Multiplanar CT image reconstructions were also generated. COMPARISON:  Chest CT 06/08/2014 FINDINGS: CT THORACIC SPINE FINDINGS T1 and a portion of the T2 vertebra is excluded from view. Given distance from symptomatic level repeat imaging not performed. If this area is of clinical interest will gladly repeat  and addend. There is no evidence of thoracic spine fracture or traumatic malalignment. Bridging osteophytes with ankylosis from T5-T12. No gross herniation or high-grade canal stenosis. There is an angular ground-glass opacity in the right upper lobe without evidence of progression since 2015, stability favoring scar. CT LUMBAR SPINE FINDINGS There is mild paravertebral fat edema around the L1-2 disc space anteriorly and to the right. An osteophyte on the right is fragmented at this level, chronicity indeterminate. There is prominent subchondral erosive changes along the anterior disc space at this level, and early discitis  is considered, although it would be unusual to be associated with vacuum phenomenon. There is subchondral erosive changes around the L3-4 disc space centrally, but no soft tissue inflammation. No posterior element fracture or subluxation. There is advanced diffuse degenerative disc and facet disease. No indication of high-grade canal stenosis or acute disc herniation. Foraminal narrowing greatest at L5-S1 bilaterally, mainly from disc narrowing and endplate spurs. Exophytic right renal cyst. IMPRESSION: CT THORACIC SPINE IMPRESSION 1. No acute finding. 2. T1 and T2 are excluded from view. CT LUMBAR SPINE IMPRESSION 1. L2 superior endplate osteophyte fracture or fragmentation, nondisplaced. Given endplate erosions around the subtle soft tissue edema at this level, please ensure no laboratory or clinical findings for discitis (presence of vacuum phenomenon makes this entity less likely by imaging). 2. L3-4 subchondral erosions without associated soft tissue inflammation. Electronically Signed   By: Marnee SpringJonathon  Watts M.D.   On: 01/22/2016 19:02   I have personally reviewed and evaluated these images and lab results as part of my medical decision-making.    MDM   Final diagnoses:  Acute exacerbation of chronic low back pain  CKD (chronic kidney disease) stage 3, GFR 30-59 ml/min  Dehydration    Travis Becker presents with acute back pain And significant decline in the last 18 hours, now unable to walk. Patient with generalized weakness and with slightly greater weakness in the right leg compared to the left. No other focal neurologic findings. Patient without slurred speech or facial droop. No right arm weakness. Highly doubt CVA.  UA without evidence of urinate tract infection. Mild leukocytosis noted. CT scan of the back shows L2 superior endplate osteophyte fracture and L3-4 subchondral erosions without soft tissue inflammation.  Patient will need MRI of his L-spine.  Pt will also need admission for decline in status and inability to walk.    8:26 PM Discussed findings with Pt and son who are comfortable with the plan.  CBC with leukocytosis. Ketones in his urine but no evidence of infection. BMP with mild hyponatremia and elevated creatinine 1.76 up from 1.3. Patient is receiving fluids.  MRI pending.  Dahlia ClientHannah Amani Nodarse, PA-C 01/22/16 2030  Tilden FossaElizabeth Rees, MD 01/23/16 [phone]

## 2016-01-23 DIAGNOSIS — N189 Chronic kidney disease, unspecified: Secondary | ICD-10-CM | POA: Diagnosis not present

## 2016-01-23 DIAGNOSIS — M5440 Lumbago with sciatica, unspecified side: Secondary | ICD-10-CM | POA: Diagnosis not present

## 2016-01-23 DIAGNOSIS — N179 Acute kidney failure, unspecified: Secondary | ICD-10-CM

## 2016-01-23 DIAGNOSIS — F0390 Unspecified dementia without behavioral disturbance: Secondary | ICD-10-CM | POA: Diagnosis not present

## 2016-01-23 LAB — BASIC METABOLIC PANEL
ANION GAP: 8 (ref 5–15)
BUN: 25 mg/dL — ABNORMAL HIGH (ref 6–20)
CHLORIDE: 105 mmol/L (ref 101–111)
CO2: 22 mmol/L (ref 22–32)
Calcium: 8.6 mg/dL — ABNORMAL LOW (ref 8.9–10.3)
Creatinine, Ser: 1.53 mg/dL — ABNORMAL HIGH (ref 0.61–1.24)
GFR calc non Af Amer: 36 mL/min — ABNORMAL LOW (ref >60)
GFR, EST AFRICAN AMERICAN: 42 mL/min — AB (ref >60)
Glucose, Bld: 126 mg/dL — ABNORMAL HIGH (ref 65–99)
POTASSIUM: 4.4 mmol/L (ref 3.5–5.1)
SODIUM: 135 mmol/L (ref 135–145)

## 2016-01-23 LAB — CBC
HEMATOCRIT: 35.3 % — AB (ref 39.0–52.0)
Hemoglobin: 11.7 g/dL — ABNORMAL LOW (ref 13.0–17.0)
MCH: 29.5 pg (ref 26.0–34.0)
MCHC: 33.1 g/dL (ref 30.0–36.0)
MCV: 88.9 fL (ref 78.0–100.0)
Platelets: 313 10*3/uL (ref 150–400)
RBC: 3.97 MIL/uL — AB (ref 4.22–5.81)
RDW: 14 % (ref 11.5–15.5)
WBC: 8.5 10*3/uL (ref 4.0–10.5)

## 2016-01-23 LAB — SEDIMENTATION RATE: Sed Rate: 92 mm/hr — ABNORMAL HIGH (ref 0–16)

## 2016-01-23 MED ORDER — PIPERACILLIN-TAZOBACTAM 3.375 G IVPB
3.3750 g | Freq: Three times a day (TID) | INTRAVENOUS | Status: DC
  Administered 2016-01-23 (×2): 3.375 g via INTRAVENOUS
  Filled 2016-01-23 (×4): qty 50

## 2016-01-23 NOTE — Consult Note (Addendum)
[facility]  Total days of antibiotics 2        Day 1 vanco        Day 1 piptazo               Reason for Consult: possible discitis   Referring Physician: gherghe  Principal Problem:   Low back pain Active Problems:   Dementia   Renal failure (ARF), acute on chronic (HCC)    HPI: Travis Becker is a 82 y.o. male with past medical history significant of dementia, tremors, GERD, hx of PE, who reports having increasing low back pain radiating to right lower extremity with weakness. Rather new onset since this started roughly 10 days ago with low back pain and was taken to the primary care physician. At that time, x-rays done at the facility showed possible compression fracture. Patient was treated with Tylenol No. 3 and tramadol which he was taking for pain control. In the 3 days PTA, the patient reported increasing pain to the point he had difficult to walk, and weightbear. On admit, he is afebrile, slightly leukocytosis of 12.6. CT Imaging of spine revealed some L2 enchanges concerning for discitis though on mri it is more consistent with edema associated with L2 acute compression fracture though unable to rule out infection.   Patient is usually walking with the help of a walker and generally walks minimal and lies on the lazy boy. He is able to feed himself 3 small meals per day, and keeps up his weight. He is usually very conversant, comedic per his son's report.  Patient's son states he has chronic incontinence of urine. Denies any fever or chills. He has not had any abtx in the last 6 months nor any recent illnesses. In the ED, exam was limited in assessing weakness due to pain in movement of RLE. He was empirically started on vancomycin plus piptazo for presumed discitis  History obtained from son over the phone as well as speaking with sara, primary care giver x 6 yr  Past Medical History  Diagnosis Date  . Chronic headaches   . PE (pulmonary embolism)  06/08/2014  . Shortness of breath   . GERD (gastroesophageal reflux disease)   . Tremors of nervous system   . Arthritis     osteo  . Dementia     Allergies: No Known Allergies   MEDICATIONS: . busPIRone  7.5 mg Oral TID  . donepezil  10 mg Oral Daily  . famotidine  20 mg Oral QHS  . loratadine  10 mg Oral Daily  . memantine  28 mg Oral Daily  . piperacillin-tazobactam (ZOSYN)  IV  3.375 g Intravenous Q8H  . [START ON 01/25/2016] vancomycin  1,000 mg Intravenous Q48H    Social History  Substance Use Topics  . Smoking status: Former Smoker -- 1.00 packs/day for 50 years    Types: Cigarettes    Quit date: 08/25/1970  . Smokeless tobacco: Never Used  . Alcohol Use: No    Family History  Problem Relation Age of Onset  . Stroke Father     Review of Systems -  Unable to obtain due to patient being somewhat solomnent due to pain meds  OBJECTIVE: Temp:  [97.4 F (36.3 C)-98.9 F (37.2 C)] 98.3 F (36.8 C) (05/31 0846) Pulse Rate:  [64-96] 82 (05/31 0846) Resp:  [14-24] 16 (05/31 0846) BP: (103-122)/(43-69) 116/43 mmHg (05/31 0846) SpO2:  [92 %-95 %] 95 % (05/31 0846)  Weight:  [183 lb 10.3 oz (83.3 kg)] 183 lb 10.3 oz (83.3 kg) (05/30 2214) Physical Exam  Constitutional: He is oriented to person, only. He appears well-developed and well-nourished. No distress.  HENT:  Mouth/Throat: Oropharynx is clear and moist. No oropharyngeal exudate.  Cardiovascular: Normal rate, regular rhythm and normal heart sounds. Exam reveals no gallop and no friction rub.  No murmur heard.  Pulmonary/Chest: Effort normal and breath sounds normal. No respiratory distress. He has no wheezes.  Abdominal: Soft. Bowel sounds are normal. He exhibits no distension. There is no tenderness.  Lymphadenopathy:  no cervical adenopathy.  Neurological: He is alert and oriented to person, place, and time. 2/5 weakness with moving lower extremities Skin: Skin is warm and dry. No rash noted. No erythema.    Psychiatric: sleeping. Only intermittently awake    LABS: Results for orders placed or performed during the hospital encounter of 01/22/16 (from the past 48 hour(s))  CBC     Status: Abnormal   Collection Time: 01/22/16  5:11 PM  Result Value Ref Range   WBC 12.6 (H) 4.0 - 10.5 K/uL   RBC 4.41 4.22 - 5.81 MIL/uL   Hemoglobin 13.2 13.0 - 17.0 g/dL   HCT 39.0 39.0 - 52.0 %   MCV 88.4 78.0 - 100.0 fL   MCH 29.9 26.0 - 34.0 pg   MCHC 33.8 30.0 - 36.0 g/dL   RDW 13.8 11.5 - 15.5 %   Platelets 351 150 - 400 K/uL  Basic metabolic panel     Status: Abnormal   Collection Time: 01/22/16  5:11 PM  Result Value Ref Range   Sodium 133 (L) 135 - 145 mmol/L   Potassium 4.6 3.5 - 5.1 mmol/L   Chloride 100 (L) 101 - 111 mmol/L   CO2 21 (L) 22 - 32 mmol/L   Glucose, Bld 120 (H) 65 - 99 mg/dL   BUN 25 (H) 6 - 20 mg/dL   Creatinine, Ser 1.76 (H) 0.61 - 1.24 mg/dL   Calcium 9.2 8.9 - 10.3 mg/dL   GFR calc non Af Amer 30 (L) >60 mL/min   GFR calc Af Amer 35 (L) >60 mL/min    Comment: (NOTE) The eGFR has been calculated using the CKD EPI equation. This calculation has not been validated in all clinical situations. eGFR's persistently <60 mL/min signify possible Chronic Kidney Disease.    Anion gap 12 5 - 15  Protime-INR     Status: None   Collection Time: 01/22/16  5:11 PM  Result Value Ref Range   Prothrombin Time 14.6 11.6 - 15.2 seconds   INR 1.12 0.00 - 1.49  Urinalysis, Routine w reflex microscopic (not at [facility])     Status: Abnormal   Collection Time: 01/22/16  6:12 PM  Result Value Ref Range   Color, Urine AMBER (A) YELLOW    Comment: BIOCHEMICALS MAY BE AFFECTED BY COLOR   APPearance CLOUDY (A) CLEAR   Specific Gravity, Urine 1.025 1.005 - 1.030   pH 5.0 5.0 - 8.0   Glucose, UA NEGATIVE NEGATIVE mg/dL   Hgb urine dipstick NEGATIVE NEGATIVE   Bilirubin Urine SMALL (A) NEGATIVE   Ketones, ur 15 (A) NEGATIVE mg/dL   Protein, ur NEGATIVE NEGATIVE mg/dL   Nitrite NEGATIVE  NEGATIVE   Leukocytes, UA NEGATIVE NEGATIVE    Comment: MICROSCOPIC NOT DONE ON URINES WITH NEGATIVE PROTEIN, BLOOD, LEUKOCYTES, NITRITE, OR GLUCOSE <1000 mg/dL.  POC CBG, ED     Status: Abnormal   Collection Time: 01/22/16  9:25 PM  Result Value Ref Range   Glucose-Capillary 103 (H) 65 - 99 mg/dL   Comment 1 Notify RN    Comment 2 Document in Chart   Sedimentation rate     Status: Abnormal   Collection Time: 01/22/16 11:35 PM  Result Value Ref Range   Sed Rate 92 (H) 0 - 16 mm/hr  Basic metabolic panel     Status: Abnormal   Collection Time: 01/23/16  7:01 AM  Result Value Ref Range   Sodium 135 135 - 145 mmol/L   Potassium 4.4 3.5 - 5.1 mmol/L   Chloride 105 101 - 111 mmol/L   CO2 22 22 - 32 mmol/L   Glucose, Bld 126 (H) 65 - 99 mg/dL   BUN 25 (H) 6 - 20 mg/dL   Creatinine, Ser 1.53 (H) 0.61 - 1.24 mg/dL   Calcium 8.6 (L) 8.9 - 10.3 mg/dL   GFR calc non Af Amer 36 (L) >60 mL/min   GFR calc Af Amer 42 (L) >60 mL/min    Comment: (NOTE) The eGFR has been calculated using the CKD EPI equation. This calculation has not been validated in all clinical situations. eGFR's persistently <60 mL/min signify possible Chronic Kidney Disease.    Anion gap 8 5 - 15  CBC     Status: Abnormal   Collection Time: 01/23/16  7:01 AM  Result Value Ref Range   WBC 8.5 4.0 - 10.5 K/uL   RBC 3.97 (L) 4.22 - 5.81 MIL/uL   Hemoglobin 11.7 (L) 13.0 - 17.0 g/dL   HCT 35.3 (L) 39.0 - 52.0 %   MCV 88.9 78.0 - 100.0 fL   MCH 29.5 26.0 - 34.0 pg   MCHC 33.1 30.0 - 36.0 g/dL   RDW 14.0 11.5 - 15.5 %   Platelets 313 150 - 400 K/uL    MICRO: 5/30 blood cx ngtd IMAGING: Ct Thoracic Spine Wo Contrast  01/22/2016  CLINICAL DATA:  Compression fracture diagnosed last Monday, with increasing pain. Subsequent encounter. EXAM: CT THORACIC AND LUMBAR SPINE WITHOUT CONTRAST TECHNIQUE: Multidetector CT imaging of the thoracic and lumbar spine was performed without contrast. Multiplanar CT image reconstructions  were also generated. COMPARISON:  Chest CT 06/08/2014 FINDINGS: CT THORACIC SPINE FINDINGS T1 and a portion of the T2 vertebra is excluded from view. Given distance from symptomatic level repeat imaging not performed. If this area is of clinical interest will gladly repeat and addend. There is no evidence of thoracic spine fracture or traumatic malalignment. Bridging osteophytes with ankylosis from T5-T12. No gross herniation or high-grade canal stenosis. There is an angular ground-glass opacity in the right upper lobe without evidence of progression since 2015, stability favoring scar. CT LUMBAR SPINE FINDINGS There is mild paravertebral fat edema around the L1-2 disc space anteriorly and to the right. An osteophyte on the right is fragmented at this level, chronicity indeterminate. There is prominent subchondral erosive changes along the anterior disc space at this level, and early discitis is considered, although it would be unusual to be associated with vacuum phenomenon. There is subchondral erosive changes around the L3-4 disc space centrally, but no soft tissue inflammation. No posterior element fracture or subluxation. There is advanced diffuse degenerative disc and facet disease. No indication of high-grade canal stenosis or acute disc herniation. Foraminal narrowing greatest at L5-S1 bilaterally, mainly from disc narrowing and endplate spurs. Exophytic right renal cyst. IMPRESSION: CT THORACIC SPINE IMPRESSION 1. No acute finding. 2. T1 and T2 are excluded from view. CT  LUMBAR SPINE IMPRESSION 1. L2 superior endplate osteophyte fracture or fragmentation, nondisplaced. Given endplate erosions around the subtle soft tissue edema at this level, please ensure no laboratory or clinical findings for discitis (presence of vacuum phenomenon makes this entity less likely by imaging). 2. L3-4 subchondral erosions without associated soft tissue inflammation. Electronically Signed   By: Monte Fantasia M.D.   On:  01/22/2016 19:02   Ct Lumbar Spine Wo Contrast  01/22/2016  CLINICAL DATA:  Compression fracture diagnosed last Monday, with increasing pain. Subsequent encounter. EXAM: CT THORACIC AND LUMBAR SPINE WITHOUT CONTRAST TECHNIQUE: Multidetector CT imaging of the thoracic and lumbar spine was performed without contrast. Multiplanar CT image reconstructions were also generated. COMPARISON:  Chest CT 06/08/2014 FINDINGS: CT THORACIC SPINE FINDINGS T1 and a portion of the T2 vertebra is excluded from view. Given distance from symptomatic level repeat imaging not performed. If this area is of clinical interest will gladly repeat and addend. There is no evidence of thoracic spine fracture or traumatic malalignment. Bridging osteophytes with ankylosis from T5-T12. No gross herniation or high-grade canal stenosis. There is an angular ground-glass opacity in the right upper lobe without evidence of progression since 2015, stability favoring scar. CT LUMBAR SPINE FINDINGS There is mild paravertebral fat edema around the L1-2 disc space anteriorly and to the right. An osteophyte on the right is fragmented at this level, chronicity indeterminate. There is prominent subchondral erosive changes along the anterior disc space at this level, and early discitis is considered, although it would be unusual to be associated with vacuum phenomenon. There is subchondral erosive changes around the L3-4 disc space centrally, but no soft tissue inflammation. No posterior element fracture or subluxation. There is advanced diffuse degenerative disc and facet disease. No indication of high-grade canal stenosis or acute disc herniation. Foraminal narrowing greatest at L5-S1 bilaterally, mainly from disc narrowing and endplate spurs. Exophytic right renal cyst. IMPRESSION: CT THORACIC SPINE IMPRESSION 1. No acute finding. 2. T1 and T2 are excluded from view. CT LUMBAR SPINE IMPRESSION 1. L2 superior endplate osteophyte fracture or fragmentation,  nondisplaced. Given endplate erosions around the subtle soft tissue edema at this level, please ensure no laboratory or clinical findings for discitis (presence of vacuum phenomenon makes this entity less likely by imaging). 2. L3-4 subchondral erosions without associated soft tissue inflammation. Electronically Signed   By: Monte Fantasia M.D.   On: 01/22/2016 19:02   Mr Lumbar Spine Wo Contrast  01/23/2016  CLINICAL DATA:  Initial evaluation for acute back pain, inability ambulate, right leg weakness. EXAM: MRI LUMBAR SPINE WITHOUT CONTRAST TECHNIQUE: Multiplanar, multisequence MR imaging of the lumbar spine was performed. No intravenous contrast was administered. COMPARISON:  Prior study from earlier same day FINDINGS: Dextroscoliosis of the thoracolumbar spine. Otherwise, vertebral bodies are normally aligned with preservation of the normal lumbar lordosis. No listhesis or malalignment. Abnormal oblique T1/T2 hypo intense signal intensity with associated edema extends through the right superior aspect of the L2 vertebral body, highly suspicious for an acute compression fracture. Minimal central height loss without bony retropulsion. There is edema within the adjacent L1-2 disc space, favored to be related to the fracture. Mild edema within the adjacent right paraspinous musculature (series 4, image 1). These changes are favored to be related to the fracture, although superimposed infection could also conceivably have this appearance. A degenerative Schmorl's node with associated edema present at the inferior endplate of L1. Additional possible subacute compression deformity through the superior endplate of L4 (series 4, image  9). Mild height loss without bony retropulsion. No other definite compression fracture. Signal intensity within the vertebral body bone marrow is markedly heterogeneous without definite discrete worrisome osseous lesion. Conus medullaris terminates normally at the L2 level. Signal  intensity within the visualized cord is normal. Nerve roots of the cauda equina within normal limits. Fatty atrophy present within the paraspinous musculature. No other acute abnormality within the visualized paraspinous soft tissues. Few scattered cysts noted within the partially visualized kidneys. L1-2: Degenerative intervertebral disc space narrowing with disc bulge and disc desiccation. Superimposed shallow right foraminal disc protrusion (series 5, image 5). No significant canal or foraminal stenosis. L2-3: Degenerative disc desiccation with intervertebral disc space narrowing and mild diffuse disc bulge. No focal disc herniation. Mild bile facet arthrosis ligamentum flavum hypertrophy. Resultant mild canal stenosis. Mild left foraminal narrowing. L3-4: Degenerative disc desiccation with disc bulge and intervertebral disc space narrowing. Bilateral facet arthrosis with ligamentum flavum hypertrophy. No focal disc herniation. Mild to moderate canal stenosis. Mild bilateral foraminal narrowing. L4-5: Degenerative disc desiccation with mild diffuse disc bulge. Bilateral facet arthrosis with ligamentum flavum thickening. Resultant mild to moderate canal stenosis. Mild right lateral recess narrowing. Foramina remain patent. L5-S1: Degenerative disc desiccation with intervertebral disc space narrowing and disc bulge. Superimposed left subarticular disc protrusion encroaching upon the lateral recess (series 6, image 35). Associated annular fissure. No significant canal stenosis. Superimposed mild facet arthrosis. Mild left foraminal narrowing. IMPRESSION: 1. Acute nondisplaced compression fracture involving the right superior aspect of L2 without significant depression. Associated edema within the adjacent right paraspinous musculature and L1-2 intervertebral disc space may be related to fracture, although careful correlation with a laboratory values and clinical history is suggested as acute infection/discitis could  also conceivably have this appearance. 2. Possible additional subacute compression fracture involving the superior aspect of L4 without significant height loss or bony retropulsion. 3. Multilevel degenerative spondylolysis as detailed above, most severe at L3-4 and L4-5 were there is mild to moderate canal narrowing. No significant foraminal stenosis within the lumbar spine. Electronically Signed   By: Jeannine Boga M.D.   On: 01/23/2016 01:23    Assessment/Plan:  83yo M with new onset back pain roughly 10 days ago that has progressed. Admitted for pain and lower extremity weakness. Imaging confirms L2 nondisplaced compression fracture with associated edema. Possibly L4 also may have subacute compression fracture. Imaging unable to to rule out early discitis. Inflammatory markers are elevated.  - despite elevated inflammatory markers, this still could fit with acute compression fracture - please consult NSGY or ortho, to whether back brace +/- vertebraplasty would be of any help with patient's pain - we will stop antibiotcs. If NSGY feels that this could be c/w discitis, would recommend IR sampling affected area - will avoid using vancomycin right now since pt has poor po intake and concern for worsening aki  aki = likley due to poor po intake prior to admit and today. Recommend IVF.  Dementia = usually is conversant, thus, i suspect this is far from his baseline. Continue to monitor. Avoid sundowning  Dr Linus Salmons to provide further [place]. [facility] [phone]

## 2016-01-23 NOTE — Progress Notes (Signed)
PROGRESS NOTE  Travis Becker ZOX:[id number] DOB: 06/21/1915 DOA: 01/22/2016 PCP: Jerral Ralph, MD     Brief Narrative: Travis Becker is a 82 y.o. male with medical history significant of dementia was brought to the ER the patient was found to have increasing low back pain radiating to right lower extremity with weakness  Assessment & Plan: Principal Problem:   Low back pain Active Problems:   Dementia   Renal failure (ARF), acute on chronic (HCC)   Low back pain with compression fracture at L2 with surrounding edema with radiculopathy and right lower extremity weakness - patient's symptoms are most likely from the compression fracture and radiculopathy secondary to the surrounding edema. Received decadron x 1. - possible discitis per MRI, ID consulted - started on antibiotics on admission, may need to hold if we were to aspirate to increase yield. Appreciate ID input - Sed rate high at 92. - Dr. Toniann Fail discussed with Dr. Bevely Palmer from neurosurgery, no role for surgery  Acute on chronic renal failure stage III with metabolic acidosis  - probably secondary to poor oral intake as patient's son states that patient has not been eating well last 2 days. Denies any nausea vomiting or diarrhea. At this time and gently hydrating and closely follow metabolic panel intake and output. - renal function with improvement today  Dementia  - on Aricept and Namenda.  Previous history of PE  - off anticoagulants presently.   DVT prophylaxis: SCD Code Status: DNR Family Communication: d/w son bedside Disposition Plan: home when ready  Consultants:   ID  Procedures:   None   Antimicrobials:  Vancomycin 5/31 >>  Zosyn 5/31   Subjective: -   Objective: Filed Vitals:   01/22/16 2214 01/23/16 0616 01/23/16 0846 01/23/16 1000  BP: 116/54 103/50 116/43   Pulse: 81 64 82   Temp: 98.9 F (37.2 C) 97.5 F (36.4 C) 98.3 F (36.8 C)   TempSrc: Axillary Axillary Oral     Resp: Height:    5' 10.87" (1.8 m)  Weight: 83.3 kg (183 lb 10.3 oz)     SpO2: 95% 92% 95%     Intake/Output Summary (Last 24 hours) at 01/23/16 1229 Last data filed at 01/23/16 1137  Gross per 24 hour  Intake 2079.16 ml  Output    100 ml  Net 1979.16 ml   Filed Weights   01/22/16 2214  Weight: 83.3 kg (183 lb 10.3 oz)    Examination: Constitutional: NAD, being fed lunch Filed Vitals:   01/22/16 2214 01/23/16 0616 01/23/16 0846 01/23/16 1000  BP: 116/54 103/50 116/43   Pulse: 81 64 82   Temp: 98.9 F (37.2 C) 97.5 F (36.4 C) 98.3 F (36.8 C)   TempSrc: Axillary Axillary Oral   Resp: Height:    5' 10.87" (1.8 m)  Weight: 83.3 kg (183 lb 10.3 oz)     SpO2: 95% 92% 95%    Eyes: PERRL, lids and conjunctivae normal ENMT: Mucous membranes are moist. Respiratory: clear to auscultation bilaterally, no wheezing, no crackles.  Cardiovascular: Regular rate and rhythm, no murmurs / rubs / gallops. No LE edema.  Abdomen: no tenderness. Bowel sounds positive.  Neurologic: 5-/5 LLE, 5/5 RLE. 5/5 bilateral upper extremities   Data Reviewed: I have personally reviewed following labs and imaging studies  CBC:  Recent Labs Lab 01/22/16 1711 01/23/16 0701  WBC 12.6* 8.5  HGB 13.2 11.7*  HCT 39.0 35.3*  MCV 88.4 88.9  PLT 351 313   Basic Metabolic Panel:  Recent Labs Lab 01/22/16 1711 01/23/16 0701  NA 133* 135  K 4.6 4.4  CL 100* 105  CO2 21* 22  GLUCOSE 120* 126*  BUN 25* 25*  CREATININE 1.76* 1.53*  CALCIUM 9.2 8.6*   GFR: Estimated Creatinine Clearance: 28.6 mL/min (by C-G formula based on Cr of 1.53). Liver Function Tests: No results for input(s): AST, ALT, ALKPHOS, BILITOT, PROT, ALBUMIN in the last 168 hours. No results for input(s): LIPASE, AMYLASE in the last 168 hours. No results for input(s): AMMONIA in the last 168 hours. Coagulation Profile:  Recent Labs Lab 01/22/16 1711  INR 1.12   Cardiac Enzymes: No results for  input(s): CKTOTAL, CKMB, CKMBINDEX, TROPONINI in the last 168 hours. BNP (last 3 results) No results for input(s): PROBNP in the last 8760 hours. HbA1C: No results for input(s): HGBA1C in the last 72 hours. CBG:  Recent Labs Lab 01/22/16 2125  GLUCAP 103*   Lipid Profile: No results for input(s): CHOL, HDL, LDLCALC, TRIG, CHOLHDL, LDLDIRECT in the last 72 hours. Thyroid Function Tests: No results for input(s): TSH, T4TOTAL, FREET4, T3FREE, THYROIDAB in the last 72 hours. Anemia Panel: No results for input(s): VITAMINB12, FOLATE, FERRITIN, TIBC, IRON, RETICCTPCT in the last 72 hours. Urine analysis:    Component Value Date/Time   COLORURINE AMBER* 01/22/2016 1812   APPEARANCEUR CLOUDY* 01/22/2016 1812   LABSPEC 1.025 01/22/2016 1812   PHURINE 5.0 01/22/2016 1812   GLUCOSEU NEGATIVE 01/22/2016 1812   HGBUR NEGATIVE 01/22/2016 1812   BILIRUBINUR SMALL* 01/22/2016 1812   KETONESUR 15* 01/22/2016 1812   PROTEINUR NEGATIVE 01/22/2016 1812   UROBILINOGEN 0.2 02/23/2015 1550   NITRITE NEGATIVE 01/22/2016 1812   LEUKOCYTESUR NEGATIVE 01/22/2016 1812   Sepsis Labs: Invalid input(s): PROCALCITONIN, LACTICIDVEN  Recent Results (from the past 240 hour(s))  Culture, blood (routine x 2)     Status: None (Preliminary result)   Collection Time: 01/22/16 11:35 PM  Result Value Ref Range Status   Specimen Description BLOOD LEFT ANTECUBITAL  Final   Special Requests BOTTLES DRAWN AEROBIC AND ANAEROBIC 8CC  Final   Culture NO GROWTH < 12 HOURS  Final   Report Status PENDING  Incomplete  Culture, blood (routine x 2)     Status: None (Preliminary result)   Collection Time: 01/22/16 11:45 PM  Result Value Ref Range Status   Specimen Description BLOOD RIGHT ANTECUBITAL  Final   Special Requests BOTTLES DRAWN AEROBIC AND ANAEROBIC 10CC  Final   Culture NO GROWTH < 12 HOURS  Final   Report Status PENDING  Incomplete      Radiology Studies: Ct Thoracic Spine Wo Contrast  01/22/2016   CLINICAL DATA:  Compression fracture diagnosed last Monday, with increasing pain. Subsequent encounter. EXAM: CT THORACIC AND LUMBAR SPINE WITHOUT CONTRAST TECHNIQUE: Multidetector CT imaging of the thoracic and lumbar spine was performed without contrast. Multiplanar CT image reconstructions were also generated. COMPARISON:  Chest CT 06/08/2014 FINDINGS: CT THORACIC SPINE FINDINGS T1 and a portion of the T2 vertebra is excluded from view. Given distance from symptomatic level repeat imaging not performed. If this area is of clinical interest will gladly repeat and addend. There is no evidence of thoracic spine fracture or traumatic malalignment. Bridging osteophytes with ankylosis from T5-T12. No gross herniation or high-grade canal stenosis. There is an angular ground-glass opacity in the right upper lobe without evidence of progression since 2015, stability favoring scar. CT LUMBAR SPINE FINDINGS There  is mild paravertebral fat edema around the L1-2 disc space anteriorly and to the right. An osteophyte on the right is fragmented at this level, chronicity indeterminate. There is prominent subchondral erosive changes along the anterior disc space at this level, and early discitis is considered, although it would be unusual to be associated with vacuum phenomenon. There is subchondral erosive changes around the L3-4 disc space centrally, but no soft tissue inflammation. No posterior element fracture or subluxation. There is advanced diffuse degenerative disc and facet disease. No indication of high-grade canal stenosis or acute disc herniation. Foraminal narrowing greatest at L5-S1 bilaterally, mainly from disc narrowing and endplate spurs. Exophytic right renal cyst. IMPRESSION: CT THORACIC SPINE IMPRESSION 1. No acute finding. 2. T1 and T2 are excluded from view. CT LUMBAR SPINE IMPRESSION 1. L2 superior endplate osteophyte fracture or fragmentation, nondisplaced. Given endplate erosions around the subtle soft  tissue edema at this level, please ensure no laboratory or clinical findings for discitis (presence of vacuum phenomenon makes this entity less likely by imaging). 2. L3-4 subchondral erosions without associated soft tissue inflammation. Electronically Signed   By: Marnee SpringJonathon  Watts M.D.   On: 01/22/2016 19:02   Ct Lumbar Spine Wo Contrast  01/22/2016  CLINICAL DATA:  Compression fracture diagnosed last Monday, with increasing pain. Subsequent encounter. EXAM: CT THORACIC AND LUMBAR SPINE WITHOUT CONTRAST TECHNIQUE: Multidetector CT imaging of the thoracic and lumbar spine was performed without contrast. Multiplanar CT image reconstructions were also generated. COMPARISON:  Chest CT 06/08/2014 FINDINGS: CT THORACIC SPINE FINDINGS T1 and a portion of the T2 vertebra is excluded from view. Given distance from symptomatic level repeat imaging not performed. If this area is of clinical interest will gladly repeat and addend. There is no evidence of thoracic spine fracture or traumatic malalignment. Bridging osteophytes with ankylosis from T5-T12. No gross herniation or high-grade canal stenosis. There is an angular ground-glass opacity in the right upper lobe without evidence of progression since 2015, stability favoring scar. CT LUMBAR SPINE FINDINGS There is mild paravertebral fat edema around the L1-2 disc space anteriorly and to the right. An osteophyte on the right is fragmented at this level, chronicity indeterminate. There is prominent subchondral erosive changes along the anterior disc space at this level, and early discitis is considered, although it would be unusual to be associated with vacuum phenomenon. There is subchondral erosive changes around the L3-4 disc space centrally, but no soft tissue inflammation. No posterior element fracture or subluxation. There is advanced diffuse degenerative disc and facet disease. No indication of high-grade canal stenosis or acute disc herniation. Foraminal narrowing  greatest at L5-S1 bilaterally, mainly from disc narrowing and endplate spurs. Exophytic right renal cyst. IMPRESSION: CT THORACIC SPINE IMPRESSION 1. No acute finding. 2. T1 and T2 are excluded from view. CT LUMBAR SPINE IMPRESSION 1. L2 superior endplate osteophyte fracture or fragmentation, nondisplaced. Given endplate erosions around the subtle soft tissue edema at this level, please ensure no laboratory or clinical findings for discitis (presence of vacuum phenomenon makes this entity less likely by imaging). 2. L3-4 subchondral erosions without associated soft tissue inflammation. Electronically Signed   By: Marnee SpringJonathon  Watts M.D.   On: 01/22/2016 19:02   Mr Lumbar Spine Wo Contrast  01/23/2016  CLINICAL DATA:  Initial evaluation for acute back pain, inability ambulate, right leg weakness. EXAM: MRI LUMBAR SPINE WITHOUT CONTRAST TECHNIQUE: Multiplanar, multisequence MR imaging of the lumbar spine was performed. No intravenous contrast was administered. COMPARISON:  Prior study from earlier same day  FINDINGS: Dextroscoliosis of the thoracolumbar spine. Otherwise, vertebral bodies are normally aligned with preservation of the normal lumbar lordosis. No listhesis or malalignment. Abnormal oblique T1/T2 hypo intense signal intensity with associated edema extends through the right superior aspect of the L2 vertebral body, highly suspicious for an acute compression fracture. Minimal central height loss without bony retropulsion. There is edema within the adjacent L1-2 disc space, favored to be related to the fracture. Mild edema within the adjacent right paraspinous musculature (series 4, image 1). These changes are favored to be related to the fracture, although superimposed infection could also conceivably have this appearance. A degenerative Schmorl's node with associated edema present at the inferior endplate of L1. Additional possible subacute compression deformity through the superior endplate of L4 (series 4,  image 9). Mild height loss without bony retropulsion. No other definite compression fracture. Signal intensity within the vertebral body bone marrow is markedly heterogeneous without definite discrete worrisome osseous lesion. Conus medullaris terminates normally at the L2 level. Signal intensity within the visualized cord is normal. Nerve roots of the cauda equina within normal limits. Fatty atrophy present within the paraspinous musculature. No other acute abnormality within the visualized paraspinous soft tissues. Few scattered cysts noted within the partially visualized kidneys. L1-2: Degenerative intervertebral disc space narrowing with disc bulge and disc desiccation. Superimposed shallow right foraminal disc protrusion (series 5, image 5). No significant canal or foraminal stenosis. L2-3: Degenerative disc desiccation with intervertebral disc space narrowing and mild diffuse disc bulge. No focal disc herniation. Mild bile facet arthrosis ligamentum flavum hypertrophy. Resultant mild canal stenosis. Mild left foraminal narrowing. L3-4: Degenerative disc desiccation with disc bulge and intervertebral disc space narrowing. Bilateral facet arthrosis with ligamentum flavum hypertrophy. No focal disc herniation. Mild to moderate canal stenosis. Mild bilateral foraminal narrowing. L4-5: Degenerative disc desiccation with mild diffuse disc bulge. Bilateral facet arthrosis with ligamentum flavum thickening. Resultant mild to moderate canal stenosis. Mild right lateral recess narrowing. Foramina remain patent. L5-S1: Degenerative disc desiccation with intervertebral disc space narrowing and disc bulge. Superimposed left subarticular disc protrusion encroaching upon the lateral recess (series 6, image 35). Associated annular fissure. No significant canal stenosis. Superimposed mild facet arthrosis. Mild left foraminal narrowing. IMPRESSION: 1. Acute nondisplaced compression fracture involving the right superior aspect  of L2 without significant depression. Associated edema within the adjacent right paraspinous musculature and L1-2 intervertebral disc space may be related to fracture, although careful correlation with a laboratory values and clinical history is suggested as acute infection/discitis could also conceivably have this appearance. 2. Possible additional subacute compression fracture involving the superior aspect of L4 without significant height loss or bony retropulsion. 3. Multilevel degenerative spondylolysis as detailed above, most severe at L3-4 and L4-5 were there is mild to moderate canal narrowing. No significant foraminal stenosis within the lumbar spine. Electronically Signed   By: Rise Mu M.D.   On: 01/23/2016 01:23     Scheduled Meds: . busPIRone  7.5 mg Oral TID  . donepezil  10 mg Oral Daily  . famotidine  20 mg Oral QHS  . loratadine  10 mg Oral Daily  . memantine  28 mg Oral Daily  . piperacillin-tazobactam (ZOSYN)  IV  3.375 g Intravenous Q8H  . [START ON 01/25/2016] vancomycin  1,000 mg Intravenous Q48H   Continuous Infusions: . sodium chloride 125 mL/hr at 01/23/16 0612      Pamella Pert, MD, PhD [facility] Pager [phone]  If 7PM-7AM, please contact night-coverage [email or web address] Password [facility] 01/23/2016, 12:29  PM

## 2016-01-23 NOTE — Care Management Obs Status (Signed)
MEDICARE OBSERVATION STATUS NOTIFICATION   Patient Details  Name: Travis JacksonCharles E Turrell MRN: [id number] Date of Birth: 1915/04/18   Medicare Observation Status Notification Given:  Yes    Akacia Boltz, Annamarie MajorCheryl U, RN 01/23/2016, 11:12 AM

## 2016-01-24 DIAGNOSIS — F0390 Unspecified dementia without behavioral disturbance: Secondary | ICD-10-CM | POA: Diagnosis not present

## 2016-01-24 DIAGNOSIS — N189 Chronic kidney disease, unspecified: Secondary | ICD-10-CM | POA: Diagnosis not present

## 2016-01-24 DIAGNOSIS — N179 Acute kidney failure, unspecified: Secondary | ICD-10-CM | POA: Diagnosis not present

## 2016-01-24 DIAGNOSIS — M5440 Lumbago with sciatica, unspecified side: Secondary | ICD-10-CM | POA: Diagnosis not present

## 2016-01-24 NOTE — Progress Notes (Signed)
Orthopedic Tech Progress Note Patient Details:  Travis JacksonCharles E Becker [birth year] [id number]  Patient ID: Travis Jacksonharles E Kliewer, male   DOB: [birth year], 82 y.o.   MRN: [id number] Called in bio-tech brace order; spoke with Richardean Chimeraathy  [name] 01/24/2016, 10:37 AM

## 2016-01-24 NOTE — Evaluation (Signed)
Physical Therapy Evaluation Patient Details Name: Travis Becker MRN: [id number] DOB: [birth year] Today's Date: 01/24/2016   History of Present Illness  Travis Becker is a 82 y.o. male with medical history significant of dementia was brought to the ER the patient was found to have increasing low back pain radiating to right lower extremity with weakness. Low back pain with compression fracture at L2 with surrounding edema with radiculopathy and right lower extremity weakness. Pt treated non-operatively and has received TLSO brace.  Clinical Impression  Pt admitted with above diagnosis. Pt currently with functional limitations due to the deficits listed below (see PT Problem List). Pt will benefit from skilled PT to increase their independence and safety with mobility to allow discharge to the venue listed below.  Pt requiring MOD to MAX of 2 for bed mobility and transfers today when attempted sit to stand transfers.  After bed mobility and sit <> stand, pt too fatigued to try lateral scoot transfers and at this time, feel pt could benefit from another session of PT in the AM for continued training of caregivers for safe dc home.  Discussed transport home with son, as pt is not going to be able to safely transfer in/out of a standard car/SUV due to pain and difficulty following instructions.  Recommend non-emergency ambulance home and son in agreement with this.  Also recommend hospital bed as pt cannot tolerate laying flat, as well as HHPT.       Follow Up Recommendations Home health PT;Supervision/Assistance - 24 hour    Equipment Recommendations  Hospital bed    Recommendations for Other Services       Precautions / Restrictions Precautions Required Braces or Orthoses: Spinal Brace Spinal Brace: Thoracolumbosacral orthotic;Other (comment) Restrictions Weight Bearing Restrictions: No      Mobility  Bed Mobility Overal bed mobility: Needs Assistance;+2 for physical assistance Bed  Mobility: Rolling;Sidelying to Sit;Sit to Sidelying Rolling: Mod assist Sidelying to sit: Mod assist;+2 for physical assistance     Sit to sidelying: Max assist;+2 for physical assistance General bed mobility comments: Caregivers and pt educated on log rolling and sideying <> sit.  Pt required +2 with sidelying <> sit and use of bed pad for positioning. Pt leaning back due to TLSO  Transfers Overall transfer level: Needs assistance Equipment used: Rolling walker (2 wheeled) Transfers: Sit to/from Stand Sit to Stand: From elevated surface;+2 physical assistance;Max assist         General transfer comment: Pt unable to stand when pushing up from bed due to inability to safely transition hands to RW.  Then allowed pt to put hands on RW and stood with MAX A of 2, plus a caregiver present. Attempted to have pt perform SPT, but unable to understand directions.  Ambulation/Gait Ambulation/Gait assistance: Max assist Ambulation Distance (Feet): 2 Feet Assistive device: Rolling walker (2 wheeled)       General Gait Details: While attempting to have pt perform SPT with RW, he began to take marching steps forward bringing his knees high, but then trying to push RW away like he was trying to get rid of it.  Had pt step backwards and returned to bed. Pt very fatigued.  Stairs            Wheelchair Mobility    Modified Rankin (Stroke Patients Only)       Balance Overall balance assessment: Needs assistance Sitting-balance support: Feet supported;Bilateral upper extremity supported Sitting balance-Leahy Scale: Poor Sitting balance - Comments: Pt leaning back  heavily due to pain and TLSO position   Standing balance support: Bilateral upper extremity supported Standing balance-Leahy Scale: Zero Standing balance comment: Requires A to maintain uprigh positioning                             Pertinent Vitals/Pain Pain Assessment: Faces Faces Pain Scale: Hurts even  more Pain Location: back Pain Descriptors / Indicators: Grimacing Pain Intervention(s): Limited activity within patient's tolerance;Monitored during session;Repositioned    Home Living Family/patient expects to be discharged to:: Private residence Living Arrangements: Other (Comment) (24 hour aides) Available Help at Discharge: Personal care attendant;Available 24 hours/day Type of Home: House Home Access: Ramped entrance     Home Layout: One level Home Equipment: Walker - 2 wheels;Bedside commode;Toilet riser;Wheelchair - manual      Prior Function Level of Independence: Needs assistance   Gait / Transfers Assistance Needed: Transfers with S and ambulates up to 36' with RW with min/guard           Hand Dominance        Extremity/Trunk Assessment   Upper Extremity Assessment: Generalized weakness           Lower Extremity Assessment: Generalized weakness      Cervical / Trunk Assessment: Other exceptions  Communication   Communication: HOH  Cognition Arousal/Alertness: Awake/alert Behavior During Therapy: WFL for tasks assessed/performed Overall Cognitive Status: History of cognitive impairments - at baseline                      General Comments General comments (skin integrity, edema, etc.): Pt bothered by TLSO brace especially in sitting.  Son and 2 caregivers present, Travis Becker and Travis Becker.    Exercises        Assessment/Plan    PT Assessment Patient needs continued PT services  PT Diagnosis Difficulty walking;Acute pain;Generalized weakness   PT Problem List Decreased strength;Decreased activity tolerance;Decreased balance;Decreased mobility;Decreased knowledge of use of DME  PT Treatment Interventions DME instruction;Functional mobility training;Therapeutic activities;Therapeutic exercise;Balance training   PT Goals (Current goals can be found in the Care Plan section) Acute Rehab PT Goals Patient Stated Goal: to return home, but with  hospital bed. PT Goal Formulation: With family Time For Goal Achievement:  Potential to Achieve Goals: Fair    Frequency Min 3X/week   Barriers to discharge Other (comment) no hospital bed and cannot lay flat due to pain.  Also, decreased ability to transfer into standard car/SUV.    Co-evaluation               End of Session Equipment Utilized During Treatment: Gait belt Activity Tolerance: Patient limited by fatigue;Patient limited by pain Patient left: in bed;with call bell/phone within reach;with family/visitor present Nurse Communication: Mobility status;Other (comment) (spoke with charge nurse about d/c)    Functional Assessment Tool Used: clinical judgement and objective findings. Functional Limitation: Changing and maintaining body position Changing and Maintaining Body Position Current Status [phone]): At least 60 percent but less than 80 percent impaired, limited or restricted Changing and Maintaining Body Position Goal Status (A2130): At least 20 percent but less than 40 percent impaired, limited or restricted    Time: 8657-8469 PT Time Calculation (min) (ACUTE ONLY): 31 min   Charges:   PT Evaluation $PT Eval Moderate Complexity: 1 Procedure PT Treatments $Therapeutic Activity: 8-22 mins   PT G Codes:   PT G-Codes **NOT FOR INPATIENT CLASS** Functional Assessment Tool Used: clinical judgement  and objective findings. Functional Limitation: Changing and maintaining body position Changing and Maintaining Body Position Current Status [phone](G8981): At least 60 percent but less than 80 percent impaired, limited or restricted Changing and Maintaining Body Position Goal Status ([id number](G8982): At least 20 percent but less than 40 percent impaired, limited or restricted    Adventhealth North PinellasMITH,Travis Becker 01/24/2016, 2:24 PM

## 2016-01-24 NOTE — Progress Notes (Signed)
PROGRESS NOTE  Travis JacksonCharles E Saia EAV:[id number] DOB: [birth year] DOA: 01/22/2016 PCP: Jerral RalphMyers, Stephanie J, MD     Brief Narrative: Travis JacksonCharles E Becker is a 82 y.o. male with medical history significant of dementia was brought to the ER the patient was found to have increasing low back pain radiating to right lower extremity with weakness  Assessment & Plan: Principal Problem:   Low back pain Active Problems:   Dementia   Renal failure (ARF), acute on chronic (HCC)   Low back pain with compression fracture at L2 with surrounding edema with radiculopathy and right lower extremity weakness - patient's symptoms are most likely from the compression fracture and radiculopathy secondary to the surrounding edema. Received decadron x 1. - possible discitis per MRI, ID consulted, discussed with Dr. Drue SecondSnider yesterday and Dr. Luciana Axeomer today, unlikely infection, antibiotics discontinued 5/31. Afebrile.  - I talked with Dr. Bevely Palmeritty from [facility] today, he recommends against surgery or vertebroplasty. Recommends TLSO brace, will order one. Also will need to work with PT today and tomorrow  Acute on chronic renal failure stage III with metabolic acidosis  - probably secondary to poor oral intake as patient's son states that patient has not been eating well last 2 days. Denies any nausea vomiting or diarrhea. At this time and gently hydrating and closely follow metabolic panel intake and output. - renal function continues to improve  Dementia  - on Aricept and Namenda.  Previous history of PE  - off anticoagulants presently.   DVT prophylaxis: SCD Code Status: DNR Family Communication: d/w son bedside Disposition Plan: home when ready  Consultants:   ID  [facility] (Phone)  Procedures:   None   Antimicrobials:  Vancomycin 5/31 >> 5/31  Zosyn 5/31 >> 5/31   Subjective: - alert, no complaints, very happy and joking. Very poor short term memory loss  Objective: Filed Vitals:   01/23/16 1644 01/23/16  1923 01/24/16 0516 01/24/16 0954  BP: 115/46 119/60 147/74 136/72  Pulse: 75 70 87 98  Temp: 97.5 F (36.4 C) 97.7 F (36.5 C) 97.4 F (36.3 C) 97.4 F (36.3 C)  TempSrc: Axillary Axillary Axillary Axillary  Resp: 17 16 16 16   Height:      Weight:  87.5 kg (192 lb 14.4 oz)    SpO2: 95% 97% 95% 98%    Intake/Output Summary (Last 24 hours) at 01/24/16 1405 Last data filed at 01/24/16 1300  Gross per 24 hour  Intake 1777.92 ml  Output    825 ml  Net 952.92 ml   [facility]   01/22/16 2214 01/23/16 1923  Weight: 83.3 kg (183 lb 10.3 oz) 87.5 kg (192 lb 14.4 oz)    Examination: Constitutional: NAD Filed Vitals:   01/23/16 1644 01/23/16 1923 01/24/16 0516 01/24/16 0954  BP: 115/46 119/60 147/74 136/72  Pulse: 75 70 87 98  Temp: 97.5 F (36.4 C) 97.7 F (36.5 C) 97.4 F (36.3 C) 97.4 F (36.3 C)  TempSrc: Axillary Axillary Axillary Axillary  Resp: 17 16 16 16   Height:      Weight:  87.5 kg (192 lb 14.4 oz)    SpO2: 95% 97% 95% 98%   Eyes: PERRL, lids and conjunctivae normal ENMT: Mucous membranes are moist. Respiratory: clear to auscultation bilaterally, no wheezing, no crackles.  Cardiovascular: Regular rate and rhythm, no murmurs / rubs / gallops. No LE edema.  Abdomen: no tenderness. Bowel sounds positive.  Neurologic: 5/5 LLE, 5/5 RLE. 5/5 bilateral upper extremities   Data Reviewed: I have  personally reviewed following labs and imaging studies  CBC:  Recent Labs Lab 01/22/16 1711 01/23/16 0701  WBC 12.6* 8.5  HGB 13.2 11.7*  HCT 39.0 35.3*  MCV 88.4 88.9  PLT 351 313   Basic Metabolic Panel:  Recent Labs Lab 01/22/16 1711 01/23/16 0701  NA 133* 135  K 4.6 4.4  CL 100* 105  CO2 21* 22  GLUCOSE 120* 126*  BUN 25* 25*  CREATININE 1.76* 1.53*  CALCIUM 9.2 8.6*   GFR: Estimated Creatinine Clearance: 28.6 mL/min (by C-G formula based on Cr of 1.53). Liver Function Tests: No results for input(s): AST, ALT, ALKPHOS, BILITOT, PROT, ALBUMIN in  the last 168 hours. No results for input(s): LIPASE, AMYLASE in the last 168 hours. No results for input(s): AMMONIA in the last 168 hours. Coagulation Profile:  Recent Labs Lab 01/22/16 1711  INR 1.12   Cardiac Enzymes: No results for input(s): CKTOTAL, CKMB, CKMBINDEX, TROPONINI in the last 168 hours. BNP (last 3 results) No results for input(s): PROBNP in the last 8760 hours. HbA1C: No results for input(s): HGBA1C in the last 72 hours. CBG:  Recent Labs Lab 01/22/16 2125  GLUCAP 103*   Lipid Profile: No results for input(s): CHOL, HDL, LDLCALC, TRIG, CHOLHDL, LDLDIRECT in the last 72 hours. Thyroid Function Tests: No results for input(s): TSH, T4TOTAL, FREET4, T3FREE, THYROIDAB in the last 72 hours. Anemia Panel: No results for input(s): VITAMINB12, FOLATE, FERRITIN, TIBC, IRON, RETICCTPCT in the last 72 hours. Urine analysis:    Component Value Date/Time   COLORURINE AMBER* 01/22/2016 1812   APPEARANCEUR CLOUDY* 01/22/2016 1812   LABSPEC 1.025 01/22/2016 1812   PHURINE 5.0 01/22/2016 1812   GLUCOSEU NEGATIVE 01/22/2016 1812   HGBUR NEGATIVE 01/22/2016 1812   BILIRUBINUR SMALL* 01/22/2016 1812   KETONESUR 15* 01/22/2016 1812   PROTEINUR NEGATIVE 01/22/2016 1812   UROBILINOGEN 0.2 02/23/2015 1550   NITRITE NEGATIVE 01/22/2016 1812   LEUKOCYTESUR NEGATIVE 01/22/2016 1812   Sepsis Labs: Invalid input(s): PROCALCITONIN, LACTICIDVEN  Recent Results (from the past 240 hour(s))  Culture, blood (routine x 2)     Status: None (Preliminary result)   Collection Time: 01/22/16 11:35 PM  Result Value Ref Range Status   Specimen Description BLOOD LEFT ANTECUBITAL  Final   Special Requests BOTTLES DRAWN AEROBIC AND ANAEROBIC 8CC  Final   Culture NO GROWTH < 12 HOURS  Final   Report Status PENDING  Incomplete  Culture, blood (routine x 2)     Status: None (Preliminary result)   Collection Time: 01/22/16 11:45 PM  Result Value Ref Range Status   Specimen Description BLOOD  RIGHT ANTECUBITAL  Final   Special Requests BOTTLES DRAWN AEROBIC AND ANAEROBIC 10CC  Final   Culture NO GROWTH < 12 HOURS  Final   Report Status PENDING  Incomplete      Radiology Studies: Ct Thoracic Spine Wo Contrast  01/22/2016  CLINICAL DATA:  Compression fracture diagnosed last Monday, with increasing pain. Subsequent encounter. EXAM: CT THORACIC AND LUMBAR SPINE WITHOUT CONTRAST TECHNIQUE: Multidetector CT imaging of the thoracic and lumbar spine was performed without contrast. Multiplanar CT image reconstructions were also generated. COMPARISON:  Chest CT 06/08/2014 FINDINGS: CT THORACIC SPINE FINDINGS T1 and a portion of the T2 vertebra is excluded from view. Given distance from symptomatic level repeat imaging not performed. If this area is of clinical interest will gladly repeat and addend. There is no evidence of thoracic spine fracture or traumatic malalignment. Bridging osteophytes with ankylosis from T5-T12. No gross  herniation or high-grade canal stenosis. There is an angular ground-glass opacity in the right upper lobe without evidence of progression since 2015, stability favoring scar. CT LUMBAR SPINE FINDINGS There is mild paravertebral fat edema around the L1-2 disc space anteriorly and to the right. An osteophyte on the right is fragmented at this level, chronicity indeterminate. There is prominent subchondral erosive changes along the anterior disc space at this level, and early discitis is considered, although it would be unusual to be associated with vacuum phenomenon. There is subchondral erosive changes around the L3-4 disc space centrally, but no soft tissue inflammation. No posterior element fracture or subluxation. There is advanced diffuse degenerative disc and facet disease. No indication of high-grade canal stenosis or acute disc herniation. Foraminal narrowing greatest at L5-S1 bilaterally, mainly from disc narrowing and endplate spurs. Exophytic right renal cyst.  IMPRESSION: CT THORACIC SPINE IMPRESSION 1. No acute finding. 2. T1 and T2 are excluded from view. CT LUMBAR SPINE IMPRESSION 1. L2 superior endplate osteophyte fracture or fragmentation, nondisplaced. Given endplate erosions around the subtle soft tissue edema at this level, please ensure no laboratory or clinical findings for discitis (presence of vacuum phenomenon makes this entity less likely by imaging). 2. L3-4 subchondral erosions without associated soft tissue inflammation. Electronically Signed   By: Marnee Spring M.D.   On: 01/22/2016 19:02   Ct Lumbar Spine Wo Contrast  01/22/2016  CLINICAL DATA:  Compression fracture diagnosed last Monday, with increasing pain. Subsequent encounter. EXAM: CT THORACIC AND LUMBAR SPINE WITHOUT CONTRAST TECHNIQUE: Multidetector CT imaging of the thoracic and lumbar spine was performed without contrast. Multiplanar CT image reconstructions were also generated. COMPARISON:  Chest CT 06/08/2014 FINDINGS: CT THORACIC SPINE FINDINGS T1 and a portion of the T2 vertebra is excluded from view. Given distance from symptomatic level repeat imaging not performed. If this area is of clinical interest will gladly repeat and addend. There is no evidence of thoracic spine fracture or traumatic malalignment. Bridging osteophytes with ankylosis from T5-T12. No gross herniation or high-grade canal stenosis. There is an angular ground-glass opacity in the right upper lobe without evidence of progression since 2015, stability favoring scar. CT LUMBAR SPINE FINDINGS There is mild paravertebral fat edema around the L1-2 disc space anteriorly and to the right. An osteophyte on the right is fragmented at this level, chronicity indeterminate. There is prominent subchondral erosive changes along the anterior disc space at this level, and early discitis is considered, although it would be unusual to be associated with vacuum phenomenon. There is subchondral erosive changes around the L3-4 disc  space centrally, but no soft tissue inflammation. No posterior element fracture or subluxation. There is advanced diffuse degenerative disc and facet disease. No indication of high-grade canal stenosis or acute disc herniation. Foraminal narrowing greatest at L5-S1 bilaterally, mainly from disc narrowing and endplate spurs. Exophytic right renal cyst. IMPRESSION: CT THORACIC SPINE IMPRESSION 1. No acute finding. 2. T1 and T2 are excluded from view. CT LUMBAR SPINE IMPRESSION 1. L2 superior endplate osteophyte fracture or fragmentation, nondisplaced. Given endplate erosions around the subtle soft tissue edema at this level, please ensure no laboratory or clinical findings for discitis (presence of vacuum phenomenon makes this entity less likely by imaging). 2. L3-4 subchondral erosions without associated soft tissue inflammation. Electronically Signed   By: Marnee Spring M.D.   On: 01/22/2016 19:02   Mr Lumbar Spine Wo Contrast  01/23/2016  CLINICAL DATA:  Initial evaluation for acute back pain, inability ambulate, right leg weakness.  EXAM: MRI LUMBAR SPINE WITHOUT CONTRAST TECHNIQUE: Multiplanar, multisequence MR imaging of the lumbar spine was performed. No intravenous contrast was administered. COMPARISON:  Prior study from earlier same day FINDINGS: Dextroscoliosis of the thoracolumbar spine. Otherwise, vertebral bodies are normally aligned with preservation of the normal lumbar lordosis. No listhesis or malalignment. Abnormal oblique T1/T2 hypo intense signal intensity with associated edema extends through the right superior aspect of the L2 vertebral body, highly suspicious for an acute compression fracture. Minimal central height loss without bony retropulsion. There is edema within the adjacent L1-2 disc space, favored to be related to the fracture. Mild edema within the adjacent right paraspinous musculature (series 4, image 1). These changes are favored to be related to the fracture, although  superimposed infection could also conceivably have this appearance. A degenerative Schmorl's node with associated edema present at the inferior endplate of L1. Additional possible subacute compression deformity through the superior endplate of L4 (series 4, image 9). Mild height loss without bony retropulsion. No other definite compression fracture. Signal intensity within the vertebral body bone marrow is markedly heterogeneous without definite discrete worrisome osseous lesion. Conus medullaris terminates normally at the L2 level. Signal intensity within the visualized cord is normal. Nerve roots of the cauda equina within normal limits. Fatty atrophy present within the paraspinous musculature. No other acute abnormality within the visualized paraspinous soft tissues. Few scattered cysts noted within the partially visualized kidneys. L1-2: Degenerative intervertebral disc space narrowing with disc bulge and disc desiccation. Superimposed shallow right foraminal disc protrusion (series 5, image 5). No significant canal or foraminal stenosis. L2-3: Degenerative disc desiccation with intervertebral disc space narrowing and mild diffuse disc bulge. No focal disc herniation. Mild bile facet arthrosis ligamentum flavum hypertrophy. Resultant mild canal stenosis. Mild left foraminal narrowing. L3-4: Degenerative disc desiccation with disc bulge and intervertebral disc space narrowing. Bilateral facet arthrosis with ligamentum flavum hypertrophy. No focal disc herniation. Mild to moderate canal stenosis. Mild bilateral foraminal narrowing. L4-5: Degenerative disc desiccation with mild diffuse disc bulge. Bilateral facet arthrosis with ligamentum flavum thickening. Resultant mild to moderate canal stenosis. Mild right lateral recess narrowing. Foramina remain patent. L5-S1: Degenerative disc desiccation with intervertebral disc space narrowing and disc bulge. Superimposed left subarticular disc protrusion encroaching upon  the lateral recess (series 6, image 35). Associated annular fissure. No significant canal stenosis. Superimposed mild facet arthrosis. Mild left foraminal narrowing. IMPRESSION: 1. Acute nondisplaced compression fracture involving the right superior aspect of L2 without significant depression. Associated edema within the adjacent right paraspinous musculature and L1-2 intervertebral disc space may be related to fracture, although careful correlation with a laboratory values and clinical history is suggested as acute infection/discitis could also conceivably have this appearance. 2. Possible additional subacute compression fracture involving the superior aspect of L4 without significant height loss or bony retropulsion. 3. Multilevel degenerative spondylolysis as detailed above, most severe at L3-4 and L4-5 were there is mild to moderate canal narrowing. No significant foraminal stenosis within the lumbar spine. Electronically Signed   By: Rise Mu M.D.   On: 01/23/2016 01:23     Scheduled Meds: . busPIRone  7.5 mg Oral TID  . donepezil  10 mg Oral Daily  . famotidine  20 mg Oral QHS  . loratadine  10 mg Oral Daily  . memantine  28 mg Oral Daily   Continuous Infusions:     Pamella Pert, MD, PhD [facility] Pager [phone]  If 7PM-7AM, please contact night-coverage [email or web address] Password [email or web address] 01/24/2016, 2:05 PM

## 2016-01-25 DIAGNOSIS — N189 Chronic kidney disease, unspecified: Secondary | ICD-10-CM | POA: Diagnosis not present

## 2016-01-25 DIAGNOSIS — N179 Acute kidney failure, unspecified: Secondary | ICD-10-CM | POA: Diagnosis not present

## 2016-01-25 DIAGNOSIS — F0390 Unspecified dementia without behavioral disturbance: Secondary | ICD-10-CM | POA: Diagnosis not present

## 2016-01-25 MED ORDER — TRAMADOL HCL 50 MG PO TABS
25.0000 mg | ORAL_TABLET | Freq: Four times a day (QID) | ORAL | Status: DC | PRN
  Administered 2016-01-25 – 2016-01-26 (×2): 25 mg via ORAL
  Filled 2016-01-25 (×3): qty 1

## 2016-01-25 MED ORDER — LORAZEPAM 2 MG/ML IJ SOLN
0.2500 mg | Freq: Once | INTRAMUSCULAR | Status: AC
  Administered 2016-01-25: 0.25 mg via INTRAVENOUS
  Filled 2016-01-25: qty 1

## 2016-01-25 MED ORDER — WHITE PETROLATUM GEL
Status: AC
  Filled 2016-01-25: qty 1

## 2016-01-25 NOTE — Progress Notes (Signed)
Physical Therapy Treatment Patient Details Name: Travis Becker MRN: [id number] DOB: [birth year] Today's Date: 01/25/2016    History of Present Illness Travis Becker is a 82 y.o. male with medical history significant of dementia was brought to the ER the patient was found to have increasing low back pain radiating to right lower extremity with weakness. Low back pain with compression fracture at L2 with surrounding edema with radiculopathy and right lower extremity weakness. Pt treated non-operatively and has received TLSO brace.    PT Comments    Saw pt today to see if he did better with lateral scoot transfer to drop arm recliner.  Pt actually required more A with bed mobility today and with heavy posterior lean.  He required +2 MAX A of therapy staff to perform lateral scoot transfer with his personal aide present and observing.  At this time, they will need a hoyer lift and pad as well for home, as it is not safe for pt to transfer via stand or lateral scoot transfers with caregivers at this time without continued training.  Discussed with caregiver and family how HHPT can continue with training at home. Caregiver educated on log rolling, TLSO (front vs. Back), need for TLSO to be on when up, and general positioning. Recommend HHPT for caregiver training, hospital bed, hoyer lift and pad, as well as non emergent ambulance transfer home.   Follow Up Recommendations  Home health PT;Supervision/Assistance - 24 hour     Equipment Recommendations  Hospital bed;Other (comment) (hoyer lift and pad)    Recommendations for Other Services       Precautions / Restrictions Precautions Required Braces or Orthoses: Spinal Brace Spinal Brace: Thoracolumbosacral orthotic;Other (comment) Restrictions Weight Bearing Restrictions: No    Mobility  Bed Mobility Overal bed mobility: Needs Assistance;+2 for physical assistance Bed Mobility: Rolling;Sidelying to Sit;Sit to Sidelying Rolling: Max  assist Sidelying to sit: Max assist;+2 for physical assistance     Sit to sidelying: Max assist;+2 for physical assistance General bed mobility comments: Attempted to don TLSO in sitting, but pt with too posterior of a lean and unable to position correctly, so returned pt supine and rolled back and forth for donning brace.  Transfers Overall transfer level: Needs assistance   Transfers: Lateral/Scoot Transfers          Lateral/Scoot Transfers: Max assist;+2 physical assistance;From elevated surface General transfer comment: MAX A to perform lateral scoot to recliner with use of bed pads and pt unable to give much A.  Ambulation/Gait                 Stairs            Wheelchair Mobility    Modified Rankin (Stroke Patients Only)       Balance Overall balance assessment: Needs assistance Sitting-balance support: Feet supported;Bilateral upper extremity supported Sitting balance-Leahy Scale: Zero Sitting balance - Comments: strong posterior lean       Standing balance comment: not attempted today                    Cognition Arousal/Alertness: Awake/alert Behavior During Therapy: WFL for tasks assessed/performed Overall Cognitive Status: History of cognitive impairments - at baseline                      Exercises      General Comments General comments (skin integrity, edema, etc.): Education provided today to pt's primary caregiver at home, Travis Becker.  She required reinforcement  that he needed to wear TLSO when up and which was front and back of TLSO.  Daughter in laws present at beginning of session, asking about home needs and mentioning hospice and asking questions about it, referred to MD.  Travis Becker, Travis Becker, in at end of session and discussed pt's status and recommendations that now include hoyer lift for OOB. He verbalized understanding.      Pertinent Vitals/Pain Pain Assessment: Faces Faces Pain Scale: Hurts even more Pain Location: back  with bed mobility Pain Descriptors / Indicators: Grimacing Pain Intervention(s): Limited activity within patient's tolerance;Monitored during session;Repositioned    Home Living                      Prior Function            PT Goals (current goals can now be found in the care plan section) Acute Rehab PT Goals Patient Stated Goal: to return home, but with hospital bed. PT Goal Formulation: With family Time For Goal Achievement:  Potential to Achieve Goals: Fair Progress towards PT goals: Not progressing toward goals - comment    Frequency  Min 3X/week    PT Plan Current plan remains appropriate    Co-evaluation             End of Session Equipment Utilized During Treatment: Gait belt;Back brace Activity Tolerance: Patient limited by fatigue Patient left: in chair;with family/visitor present     Time: 0911-0949 PT Time Calculation (min) (ACUTE ONLY): 38 min  Charges:  $Therapeutic Activity: 38-52 mins                    G Codes:      Travis Becker 01/25/2016, 9:57 AM

## 2016-01-25 NOTE — Progress Notes (Addendum)
_ PROGRESS NOTE  Travis JacksonCharles E Stetson ZOX:[id number] DOB: 1914/10/21 DOA: 01/22/2016 PCP: Jerral RalphMyers, Stephanie J, MD     Brief Narrative: Travis Becker is a 82 y.o. male with medical history significant of dementia was brought to the ER the patient was found to have increasing low back pain radiating to right lower extremity with weakness  Assessment & Plan: Principal Problem:   Low back pain Active Problems:   Dementia   Renal failure (ARF), acute on chronic (HCC)   Low back pain with compression fracture at L2 with surrounding edema with radiculopathy and right lower extremity weakness - patient's symptoms are most likely from the compression fracture and radiculopathy secondary to the surrounding edema. Received decadron x 1. - possible discitis per MRI, ID consulted, discussed with Dr. Drue SecondSnider and Dr. Luciana Axeomer, unlikely infection, antibiotics discontinued 5/31. Afebrile.  - I talked with Dr. Bevely Palmeritty from [facility] 6/1, he recommends against surgery. Recommends TLSO brace, now fitted.  - difficulties with working with PT.  - I have asked IR to evaluate. Patient medically not safe for home return without proper safety measures in place.   Acute on chronic renal failure stage III with metabolic acidosis  - probably secondary to poor oral intake as patient's son states that patient has not been eating well last 2 days. Denies any nausea vomiting or diarrhea. At this time and gently hydrating and closely follow metabolic panel intake and output. - renal function continues to improve, stable  Dementia  - on Aricept and Namenda. - very agitated post ativan last night, avoid at all costs  Previous history of PE  - off anticoagulants presently.   DVT prophylaxis: SCD Code Status: DNR Family Communication: d/w son bedside Disposition Plan: home when ready  Consultants:   ID  [facility] (Phone)  Procedures:   None   Antimicrobials:  Vancomycin 5/31 >> 5/31  Zosyn 5/31 >> 5/31   Subjective: -  alert, no complaints  Objective: Filed Vitals:   01/24/16 1728 01/24/16 2110 01/25/16 0624 01/25/16 0819  BP: 138/63 116/65 147/78 164/66  Pulse: 64 82 85 70  Temp: 98.1 F (36.7 C) 98.5 F (36.9 C) 98.1 F (36.7 C) 97.9 F (36.6 C)  TempSrc: Axillary Oral Oral Oral  Resp: 18 19 19 18   Height:      Weight:  88.7 kg (195 lb 8.8 oz)    SpO2: 98% 95% 96% 93%    Intake/Output Summary (Last 24 hours) at 01/25/16 1454 Last data filed at 01/25/16 1300  Gross per 24 hour  Intake    480 ml  Output   1325 ml  Net   -845 ml   Filed Weights   01/22/16 2214 01/23/16 1923 01/24/16 2110  Weight: 83.3 kg (183 lb 10.3 oz) 87.5 kg (192 lb 14.4 oz) 88.7 kg (195 lb 8.8 oz)    Examination: Constitutional: NAD Filed Vitals:   01/24/16 1728 01/24/16 2110 01/25/16 0624 01/25/16 0819  BP: 138/63 116/65 147/78 164/66  Pulse: 64 82 85 70  Temp: 98.1 F (36.7 C) 98.5 F (36.9 C) 98.1 F (36.7 C) 97.9 F (36.6 C)  TempSrc: Axillary Oral Oral Oral  Resp: 18 19 19 18   Height:      Weight:  88.7 kg (195 lb 8.8 oz)    SpO2: 98% 95% 96% 93%   Eyes: PERRL, lids and conjunctivae normal ENMT: Mucous membranes are moist. Respiratory: clear to auscultation bilaterally, no wheezing, no crackles.  Cardiovascular: Regular rate and rhythm, no murmurs /  rubs / gallops. No LE edema.  Abdomen: no tenderness. Bowel sounds positive.  Neurologic: 5/5 LLE, 5/5 RLE. 5/5 bilateral upper extremities   Data Reviewed: I have personally reviewed following labs and imaging studies  CBC:  Recent Labs Lab 01/22/16 1711 01/23/16 0701  WBC 12.6* 8.5  HGB 13.2 11.7*  HCT 39.0 35.3*  MCV 88.4 88.9  PLT 351 313   Basic Metabolic Panel:  Recent Labs Lab 01/22/16 1711 01/23/16 0701  NA 133* 135  K 4.6 4.4  CL 100* 105  CO2 21* 22  GLUCOSE 120* 126*  BUN 25* 25*  CREATININE 1.76* 1.53*  CALCIUM 9.2 8.6*   GFR: Estimated Creatinine Clearance: 28.6 mL/min (by C-G formula based on Cr of 1.53). Liver  Function Tests: No results for input(s): AST, ALT, ALKPHOS, BILITOT, PROT, ALBUMIN in the last 168 hours. No results for input(s): LIPASE, AMYLASE in the last 168 hours. No results for input(s): AMMONIA in the last 168 hours. Coagulation Profile:  Recent Labs Lab 01/22/16 1711  INR 1.12   Cardiac Enzymes: No results for input(s): CKTOTAL, CKMB, CKMBINDEX, TROPONINI in the last 168 hours. BNP (last 3 results) No results for input(s): PROBNP in the last 8760 hours. HbA1C: No results for input(s): HGBA1C in the last 72 hours. CBG:  Recent Labs Lab 01/22/16 2125  GLUCAP 103*   Lipid Profile: No results for input(s): CHOL, HDL, LDLCALC, TRIG, CHOLHDL, LDLDIRECT in the last 72 hours. Thyroid Function Tests: No results for input(s): TSH, T4TOTAL, FREET4, T3FREE, THYROIDAB in the last 72 hours. Anemia Panel: No results for input(s): VITAMINB12, FOLATE, FERRITIN, TIBC, IRON, RETICCTPCT in the last 72 hours. Urine analysis:    Component Value Date/Time   COLORURINE AMBER* 01/22/2016 1812   APPEARANCEUR CLOUDY* 01/22/2016 1812   LABSPEC 1.025 01/22/2016 1812   PHURINE 5.0 01/22/2016 1812   GLUCOSEU NEGATIVE 01/22/2016 1812   HGBUR NEGATIVE 01/22/2016 1812   BILIRUBINUR SMALL* 01/22/2016 1812   KETONESUR 15* 01/22/2016 1812   PROTEINUR NEGATIVE 01/22/2016 1812   UROBILINOGEN 0.2 02/23/2015 1550   NITRITE NEGATIVE 01/22/2016 1812   LEUKOCYTESUR NEGATIVE 01/22/2016 1812   Sepsis Labs: Invalid input(s): PROCALCITONIN, LACTICIDVEN  Recent Results (from the past 240 hour(s))  Culture, blood (routine x 2)     Status: None (Preliminary result)   Collection Time: 01/22/16 11:35 PM  Result Value Ref Range Status   Specimen Description BLOOD LEFT ANTECUBITAL  Final   Special Requests BOTTLES DRAWN AEROBIC AND ANAEROBIC 8CC  Final   Culture NO GROWTH 1 DAY  Final   Report Status PENDING  Incomplete  Culture, blood (routine x 2)     Status: None (Preliminary result)   Collection  Time: 01/22/16 11:45 PM  Result Value Ref Range Status   Specimen Description BLOOD RIGHT ANTECUBITAL  Final   Special Requests BOTTLES DRAWN AEROBIC AND ANAEROBIC 10CC  Final   Culture NO GROWTH 1 DAY  Final   Report Status PENDING  Incomplete      Radiology Studies: No results found.   Scheduled Meds: . busPIRone  7.5 mg Oral TID  . donepezil  10 mg Oral Daily  . famotidine  20 mg Oral QHS  . loratadine  10 mg Oral Daily  . memantine  28 mg Oral Daily   Continuous Infusions:    Time spent: 35 minutes, on the floor in direct discussions with patient's son twice, with CM and with radiology, and more than 50% spent in counseling regarding PT evaluation, home safety and  kyphoplasty procedure  Pamella Pert, MD, PhD [facility] Pager [phone]  If 7PM-7AM, please contact night-coverage [email or web address] Password [email or web address] 01/25/2016, 2:54 PM

## 2016-01-25 NOTE — Progress Notes (Signed)
Patient ID: Travis JacksonCharles E Troiano, male   DOB: [birth year], 82 y.o.   MRN: [id number]  Received request for possible Lumbar 2 KP/VP  Dr Loreta AveWagner has reviewed imaging and approves procedure MD aware procedure will need to be approved/pre-certified with Insurance  Plan for early nest week Can be as OP if MD feels appropriate

## 2016-01-26 ENCOUNTER — Observation Stay (HOSPITAL_COMMUNITY): Payer: Medicare Other

## 2016-01-26 DIAGNOSIS — N179 Acute kidney failure, unspecified: Secondary | ICD-10-CM | POA: Diagnosis not present

## 2016-01-26 DIAGNOSIS — M5440 Lumbago with sciatica, unspecified side: Secondary | ICD-10-CM | POA: Diagnosis not present

## 2016-01-26 DIAGNOSIS — F0390 Unspecified dementia without behavioral disturbance: Secondary | ICD-10-CM | POA: Diagnosis not present

## 2016-01-26 DIAGNOSIS — N189 Chronic kidney disease, unspecified: Secondary | ICD-10-CM | POA: Diagnosis not present

## 2016-01-26 NOTE — Progress Notes (Signed)
Physical Therapy Treatment Patient Details Name: Travis JacksonCharles E Becker MRN: [id number] DOB: 01-15-1915 Today's Date: 01/26/2016    History of Present Illness Travis Becker is a 82 y.o. male with medical history significant of dementia was brought to the ER the patient was found to have increasing low back pain radiating to right lower extremity with weakness. Low back pain with compression fracture at L2 with surrounding edema with radiculopathy and right lower extremity weakness. Pt treated non-operatively and has received TLSO brace.    PT Comments    Continuing to work on caregiver education; Able to roll x3 to right side with Maralyn SagoSarah, his caregiver provididng some assist; [name] was very painful with any movement, and at times resisted rolling; we decided to give pain meds a try, so RN to medicate him and I plan to return later to see if premedicating for pain will help with activity tolerance;   Session limited by pt's need to have a BM as well; Assisted him onto the bedpan and noteifed RN and Nurse Tech of status;    Follow Up Recommendations  Home health PT;Supervision/Assistance - 24 hour     Equipment Recommendations  Rolling walker with 5" wheels;3in1 (PT);Hospital bed;Other (comment) (hoyer lift; ambulance transport home)    Recommendations for Other Services       Precautions / Restrictions Precautions Precautions: Back Precaution Comments: Educated caregiver and son re: back precautions Required Braces or Orthoses: Spinal Brace Spinal Brace: Thoracolumbosacral orthotic;Other (comment)    Mobility  Bed Mobility Overal bed mobility: Needs Assistance;+2 for physical assistance Bed Mobility: Rolling Rolling: Max assist Sidelying to sit: Max assist;+2 for physical assistance       General bed mobility comments: Initiated caregiver training in assisting pt to roll; rolled to R side 3times with cues for pt to use rails, and max assist; [name] was quite painful and  resistent to rolling; on third roll we noted he was moving his bowels, so assisted him onto bedpan  Transfers                    Ambulation/Gait                 Stairs            Wheelchair Mobility    Modified Rankin (Stroke Patients Only)       Balance                                    Cognition Arousal/Alertness: Lethargic Behavior During Therapy: WFL for tasks assessed/performed Overall Cognitive Status: History of cognitive impairments - at baseline (but sleepier than yesterday)                      Exercises      General Comments        Pertinent Vitals/Pain Pain Assessment: Faces Faces Pain Scale: Hurts whole lot Pain Location: Back pain with rolling Pain Descriptors / Indicators: Grimacing;Guarding (yelling) Pain Intervention(s): Monitored during session;Repositioned;Patient requesting pain meds-RN notified    Home Living                      Prior Function            PT Goals (current goals can now be found in the care plan section) Acute Rehab PT Goals Patient Stated Goal: home today PT Goal Formulation: With  family Time For Goal Achievement:  Potential to Achieve Goals: Fair Progress towards PT goals: Not progressing toward goals - comment (limited by pain)    Frequency  Min 3X/week    PT Plan Current plan remains appropriate    Co-evaluation             End of Session Equipment Utilized During Treatment: Back brace Activity Tolerance: Patient limited by pain Patient left: in bed;with family/visitor present     Time: 1130-1155 PT Time Calculation (min) (ACUTE ONLY): 25 min  Charges:  $Therapeutic Activity: 23-37 mins                    G CodesOlen Pel 01/26/2016, 12:27 PM  Van Clines, [place]  Acute Rehabilitation Services Pager [phone] Office [phone]

## 2016-01-26 NOTE — Progress Notes (Signed)
CM met with pt and son, Travis Becker [phone], in room to confirm choice of [facility] for HHRN/PT.  Travis Becker confirms and requests he, Travis Becker is contact for home health agency.  Referral called to [facility] rep, Tiffany.  Travis Becker has a CNA at home, Judson Roch, who is caretaker of pt.  Travis Becker states DME will be delivered by this afternoon.  CM will arrange for PTAR transport home.

## 2016-01-26 NOTE — Progress Notes (Signed)
Physical Therapy Treatment Patient Details Name: Travis Becker MRN: [id number] DOB: [birth year] Today's Date: 01/26/2016    History of Present Illness Travis Becker is a 82 y.o. male with medical history significant of dementia was brought to the ER the patient was found to have increasing low back pain radiating to right lower extremity with weakness. Low back pain with compression fracture at L2 with surrounding edema with radiculopathy and right lower extremity weakness. Pt treated non-operatively and has received TLSO brace.    PT Comments    Performed Maximove transfer today in practice for anticipated need for hoyer lift for safe transfers at home; he tolerated the maximove transfer well with TLSO on;   Son, Brett Canales, and caregiver, Huntley Dec present and participating in session; We rolled Travis Becker multiple times for bed pan, hygeine, brace donning, and maximove pad placement, and with each roll the pt was cued to reach for the rail and pull to roll; After one roll in session, Travis Becker told us his L wrist hurt; was painful with ROM, painful to palpation ulnar side, and noted some swelling at ulnar side as well compared to R; we had him not use that arm the rest of the session;   Notified RN, applied ice to L wrist and elevated it; Paged Dr. Elvera Lennox, and described the nature of the injury to him; Dr Elvera Lennox ordered x-ray and splint  Follow Up Recommendations  Home health PT;Supervision/Assistance - 24 hour     Equipment Recommendations  Rolling walker with 5" wheels;3in1 (PT);Hospital bed;Other (comment) (hoyer lift; ambulance transport home)    Recommendations for Other Services       Precautions / Restrictions Precautions Precautions: Back Precaution Comments: Educated caregiver and son re: back precautions Required Braces or Orthoses: Spinal Brace;Other Brace/Splint Spinal Brace: Thoracolumbosacral orthotic;Other (comment) Spinal Brace Comments: May don TLSO sitting, however  it is more comfortable for the pt to don in supine rolling Other Brace/Splint: Splint ordered for L wrist    Mobility  Bed Mobility Overal bed mobility: Needs Assistance;+2 for physical assistance Bed Mobility: Rolling Rolling: Max assist         General bed mobility comments:  Continued caregiver training in assisting pt to roll; rolled to R side 3times with cues for pt to use rails, and max assist; Travis Becker was quite painful and resistent to rolling; on third roll  at one point we noted he was moving his bowels, so assisted him onto bedpan; rolled again to assist with cleanup/hygeine; then rolled to don brace and place Maximove pad  Transfers Overall transfer level: Needs assistance               General transfer comment: performed bed to chair and then back to bed transfers with [facility]; Educated son and caregiver on similarities and differences between Travis Becker and the hoyer lift they will have at home; pt tolerated the [facility] well  Ambulation/Gait                 Stairs            Wheelchair Mobility    Modified Rankin (Stroke Patients Only)       Balance                                    Cognition Arousal/Alertness: Awake/alert Behavior During Therapy: WFL for tasks assessed/performed Overall Cognitive Status: History of cognitive impairments - at  baseline                      Exercises      General Comments General comments (skin integrity, edema, etc.): Educated pt and caregiver on body mechanics; plan is to dc today via PTAR      Pertinent Vitals/Pain Pain Assessment: Faces Faces Pain Scale: Hurts whole lot Pain Location: Back pain with movement; Also with new L wrist pain this session Pain Descriptors / Indicators: Grimacing;Guarding Pain Intervention(s): Limited activity within patient's tolerance;Monitored during session;Premedicated before session;Repositioned (MD notified of wrist pain)    Home Living                       Prior Function            PT Goals (current goals can now be found in the care plan section) Acute Rehab PT Goals Patient Stated Goal: home today PT Goal Formulation: With family Time For Goal Achievement:  Potential to Achieve Goals: Fair Progress towards PT goals: Progressing toward goals    Frequency  Min 3X/week    PT Plan Current plan remains appropriate    Co-evaluation             End of Session Equipment Utilized During Treatment: Back brace Activity Tolerance: Patient limited by pain Patient left: in bed;with family/visitor present     Time: 1346-1448 (minus approx 5 minutes on bedpan) PT Time Calculation (min) (ACUTE ONLY): 62 min  Charges:  $Therapeutic Activity: 53-67 mins                    G Codes:  Functional Assessment Tool Used: Clinical Judgement Functional Limitation: Changing and maintaining body position Changing and Maintaining Body Position Goal Status ([phone]): At least 20 percent but less than 40 percent impaired, limited or restricted Changing and Maintaining Body Position Discharge Status [phone]): At least 60 percent but less than 80 percent impaired, limited or restricted   Van ClinesGarrigan, Marks Scalera [facility] 01/26/2016, 4:37 PM  Van ClinesHolly Rushton Early, PT  Acute Rehabilitation Services Pager [phone] Office [phone]

## 2016-01-26 NOTE — Progress Notes (Signed)
PT Cancellation Note  Patient Details Name: Travis Becker MRN: [id number] DOB: 1915/01/22   Cancelled Treatment:    Reason Eval/Treat Not Completed: Other (comment)   Travis Becker is very sleepy right now; arousable, but falling back asleep;  Today's session is to focus on caregiver training in use of lift; the appropriate pad is not avialable right now -- [facility] has ordered it; plan is for them to page me when correct lift pad arrives;   Travis Becker, [place]  Acute Rehabilitation Services Pager [phone] Office [phone]    Travis Becker, Travis Becker [facility] 01/26/2016, 10:13 AM

## 2016-01-26 NOTE — Discharge Instructions (Signed)
Follow with Interventional Radiology next week  Please get a complete blood count and chemistry panel checked by your Primary MD at your next visit, and again as instructed by your Primary MD. Please get your medications reviewed and adjusted by your Primary MD.  Please request your Primary MD to go over all Hospital Tests and Procedure/Radiological results at the follow up, please get all Hospital records sent to your Prim MD by signing hospital release before you go home.  If you had Pneumonia of Lung problems at the Hospital: Please get a 2 view Chest X ray done in 6-8 weeks after hospital discharge or sooner if instructed by your Primary MD.  If you have Congestive Heart Failure: Please call your Cardiologist or Primary MD anytime you have any of the following symptoms:  1) 3 pound weight gain in 24 hours or 5 pounds in 1 week  2) shortness of breath, with or without a dry hacking cough  3) swelling in the hands, feet or stomach  4) if you have to sleep on extra pillows at night in order to breathe  Follow cardiac low salt diet and 1.5 lit/day fluid restriction.  If you have diabetes Accuchecks 4 times/day, Once in AM empty stomach and then before each meal. Log in all results and show them to your primary doctor at your next visit. If any glucose reading is under 80 or above 300 call your primary MD immediately.  If you have Seizure/Convulsions/Epilepsy: Please do not drive, operate heavy machinery, participate in activities at heights or participate in high speed sports until you have seen by Primary MD or a Neurologist and advised to do so again.  If you had Gastrointestinal Bleeding: Please ask your Primary MD to check a complete blood count within one week of discharge or at your next visit. Your endoscopic/colonoscopic biopsies that are pending at the time of discharge, will also need to followed by your Primary MD.  Get Medicines reviewed and adjusted. Please take all your  medications with you for your next visit with your Primary MD  Please request your Primary MD to go over all hospital tests and procedure/radiological results at the follow up, please ask your Primary MD to get all Hospital records sent to his/her office.  If you experience worsening of your admission symptoms, develop shortness of breath, life threatening emergency, suicidal or homicidal thoughts you must seek medical attention immediately by calling 911 or calling your MD immediately  if symptoms less severe.  You must read complete instructions/literature along with all the possible adverse reactions/side effects for all the Medicines you take and that have been prescribed to you. Take any new Medicines after you have completely understood and accpet all the possible adverse reactions/side effects.   Do not drive or operate heavy machinery when taking Pain medications.   Do not take more than prescribed Pain, Sleep and Anxiety Medications  Special Instructions: If you have smoked or chewed Tobacco  in the last 2 yrs please stop smoking, stop any regular Alcohol  and or any Recreational drug use.  Wear Seat belts while driving.  Please note You were cared for by a hospitalist during your hospital stay. If you have any questions about your discharge medications or the care you received while you were in the hospital after you are discharged, you can call the unit and asked to speak with the hospitalist on call if the hospitalist that took care of you is not available. Once you are  discharged, your primary care physician will handle any further medical issues. Please note that NO REFILLS for any discharge medications will be authorized once you are discharged, as it is imperative that you return to your primary care physician (or establish a relationship with a primary care physician if you do not have one) for your aftercare needs so that they can reassess your need for medications and monitor your  lab values.  You can reach the hospitalist office at phone [phone] or fax [phone]   If you do not have a primary care physician, you can call [phone] for a physician referral.  Activity: As tolerated with Full fall precautions use walker/cane & assistance as needed  Diet: regular  Disposition Home with HHPT

## 2016-01-26 NOTE — Discharge Summary (Signed)
Physician Discharge Summary  Freda JacksonCharles E Juenger ZOX:[id number] DOB: 05-16-1915 DOA: 01/22/2016  PCP: Jerral RalphMyers, Stephanie J, MD  Admit date: 01/22/2016 Discharge date: 01/26/2016  Time spent: > 30 minutes  Recommendations for Outpatient Follow-up:  1. Follow up with IR in 3-4 days  Discharge Diagnoses:  Principal Problem:   Low back pain Active Problems:   Dementia   Renal failure (ARF), acute on chronic [facility](HCC)   Discharge Condition: stable  Diet recommendation: regular  Filed Weights   01/23/16 1923 01/24/16 2110 01/25/16 2016  Weight: 87.5 kg (192 lb 14.4 oz) 88.7 kg (195 lb 8.8 oz) 85.3 kg (188 lb 0.8 oz)    History of present illness:  See H&P, Labs, Consult and Test reports for all details in brief, patient is a 82 y.o. male with medical history significant of dementia was brought to the ER the patient was found to have increasing low back pain radiating to right lower extremity with weakness  Hospital Course:  Low back pain with compression fracture at L2 with surrounding edema with radiculopathy and right lower extremity weakness - patient's symptoms are most likely from the compression fracture and radiculopathy secondary to the surrounding edema. Received decadron x 1, will not continue As per neurosurgery recommendations - possible discitis per MRI, ID consulted, discussed with Dr. Drue SecondSnider and Dr. Luciana Axeomer, unlikely infection, antibiotics discontinued 5/31. Afebrile and VSS since  - I talked with Dr. Bevely Palmeritty from [facility] 6/1, he recommends against surgery or steroids. Recommends TLSO brace, now fitted.  - difficulties with working with PT, obtained hospital bed, hoyer lift for home, HHPT, RN - consulted IR for kyphoplasty, he is a candidate, radiology sent paperwork to get insurance authorization and once its back patient will be scheduled, and this can be done as an outpatient. I ordered the procedure. Acute on chronic renal failure stage III with metabolic acidosis  - probably  secondary to poor oral intake as patient's son states that patient has not been eating well last 4 days. Denies any nausea vomiting or diarrhea.Renal function improved to baseline. Dementia - on Aricept and Namenda. Previous history of PE - off anticoagulants presently.   Procedures:  None   Consultations:  ID  Neurosurgery (phone - Dr. Bevely Palmeritty)  IR  Discharge Exam: Filed Vitals:   01/26/16 0853 01/26/16 1611 01/26/16 1910 01/26/16 1927  BP: 118/65 146/73  112/65  Pulse: 88 68  91  Temp: 97.5 F (36.4 C) 97.8 F (36.6 C) 100 F (37.8 C) 99.8 F (37.7 C)  TempSrc: Oral Oral Oral Axillary  Resp: 16 16  16   Height:      Weight:      SpO2: 95% 87% 95% 95%    General: NAD Cardiovascular: RRR Respiratory: CTA biL  Discharge Instructions Activity:  As tolerated   Get Medicines reviewed and adjusted: Please take all your medications with you for your next visit with your Primary MD  Please request your Primary MD to go over all hospital tests and procedure/radiological results at the follow up, please ask your Primary MD to get all Hospital records sent to his/her office.  If you experience worsening of your admission symptoms, develop shortness of breath, life threatening emergency, suicidal or homicidal thoughts you must seek medical attention immediately by calling 911 or calling your MD immediately if symptoms less severe.  You must read complete instructions/literature along with all the possible adverse reactions/side effects for all the Medicines you take and that have been prescribed to you. Take any  new Medicines after you have completely understood and accpet all the possible adverse reactions/side effects.   Do not drive when taking Pain medications.   Do not take more than prescribed Pain, Sleep and Anxiety Medications  Special Instructions: If you have smoked or chewed Tobacco in the last 2 yrs please stop smoking, stop any regular Alcohol and or any  Recreational drug use.  Wear Seat belts while driving.  Please note  You were cared for by a hospitalist during your hospital stay. Once you are discharged, your primary care physician will handle any further medical issues. Please note that NO REFILLS for any discharge medications will be authorized once you are discharged, as it is imperative that you return to your primary care physician (or establish a relationship with a primary care physician if you do not have one) for your aftercare needs so that they can reassess your need for medications and monitor your lab values.    Medication List    STOP taking these medications        pantoprazole 40 MG tablet  Commonly known as:  PROTONIX     warfarin 2.5 MG tablet  Commonly known as:  COUMADIN      TAKE these medications        acetaminophen 325 MG tablet  Commonly known as:  TYLENOL  Take 650 mg by mouth daily as needed for mild pain.     busPIRone 7.5 MG tablet  Commonly known as:  BUSPAR  Take 1 tablet by mouth 3 (three) times daily.     diphenhydramine-acetaminophen 25-500 MG Tabs tablet  Commonly known as:  TYLENOL PM  Take 1 tablet by mouth at bedtime as needed. For sleep     donepezil 10 MG tablet  Commonly known as:  ARICEPT  Take 10 mg by mouth daily.     famotidine 20 MG tablet  Commonly known as:  PEPCID  Take 20 mg by mouth at bedtime.     loratadine 10 MG tablet  Commonly known as:  CLARITIN  Take 10 mg by mouth daily.     multivitamin with minerals tablet  Take 1 tablet by mouth daily.     NAMENDA XR 28 MG Cp24 24 hr capsule  Generic drug:  memantine  Take 1 tablet by mouth daily.     OSTEO BI-FLEX JOINT SHIELD Tabs  Take 1 tablet by mouth daily.     traMADol 50 MG tablet  Commonly known as:  ULTRAM  Take 50 mg by mouth every 6 (six) hours as needed for moderate pain.           Follow-up Information    Follow up with WAGNER, JAIME, DO In 1 week.   Specialty:  Interventional Radiology    Contact information:   [address] [place], [place] [address] [phone]       Follow up with [facility].   Why:  home health nurse and physical therapist and hoyer lift and hospital bed   Contact information:   [address] [place] [address] [phone]       The results of significant diagnostics from this hospitalization (including imaging, microbiology, ancillary and laboratory) are listed below for reference.    Significant Diagnostic Studies: Dg Wrist 2 Views Left  01/26/2016  CLINICAL DATA:  82 year old male with left wrist pain and swelling for 1 day. Initial encounter. EXAM: LEFT WRIST - 2 VIEW COMPARISON:  01/12/2009 FINDINGS: There is no  evidence of acute fracture, subluxation or dislocation. Degenerative changes within the wrist identified, moderate-severe at the radiocarpal and first carpometacarpal joints. Soft tissue swelling is noted. No suspicious focal bony lesions identified. IMPRESSION: Soft tissue swelling without acute bony abnormality. Degenerative changes within the wrist, moderate -severe at the radiocarpal and first carpometacarpal joints. Electronically Signed   By: Harmon Pier M.D.   On: 01/26/2016 16:35   Ct Thoracic Spine Wo Contrast  01/22/2016  CLINICAL DATA:  Compression fracture diagnosed last Monday, with increasing pain. Subsequent encounter. EXAM: CT THORACIC AND LUMBAR SPINE WITHOUT CONTRAST TECHNIQUE: Multidetector CT imaging of the thoracic and lumbar spine was performed without contrast. Multiplanar CT image reconstructions were also generated. COMPARISON:  Chest CT 06/08/2014 FINDINGS: CT THORACIC SPINE FINDINGS T1 and a portion of the T2 vertebra is excluded from view. Given distance from symptomatic level repeat imaging not performed. If this area is of clinical interest will gladly repeat and addend. There is no evidence of thoracic spine fracture or traumatic malalignment. Bridging osteophytes with  ankylosis from T5-T12. No gross herniation or high-grade canal stenosis. There is an angular ground-glass opacity in the right upper lobe without evidence of progression since 2015, stability favoring scar. CT LUMBAR SPINE FINDINGS There is mild paravertebral fat edema around the L1-2 disc space anteriorly and to the right. An osteophyte on the right is fragmented at this level, chronicity indeterminate. There is prominent subchondral erosive changes along the anterior disc space at this level, and early discitis is considered, although it would be unusual to be associated with vacuum phenomenon. There is subchondral erosive changes around the L3-4 disc space centrally, but no soft tissue inflammation. No posterior element fracture or subluxation. There is advanced diffuse degenerative disc and facet disease. No indication of high-grade canal stenosis or acute disc herniation. Foraminal narrowing greatest at L5-S1 bilaterally, mainly from disc narrowing and endplate spurs. Exophytic right renal cyst. IMPRESSION: CT THORACIC SPINE IMPRESSION 1. No acute finding. 2. T1 and T2 are excluded from view. CT LUMBAR SPINE IMPRESSION 1. L2 superior endplate osteophyte fracture or fragmentation, nondisplaced. Given endplate erosions around the subtle soft tissue edema at this level, please ensure no laboratory or clinical findings for discitis (presence of vacuum phenomenon makes this entity less likely by imaging). 2. L3-4 subchondral erosions without associated soft tissue inflammation. Electronically Signed   By: Marnee Spring M.D.   On: 01/22/2016 19:02   Ct Lumbar Spine Wo Contrast  01/22/2016  CLINICAL DATA:  Compression fracture diagnosed last Monday, with increasing pain. Subsequent encounter. EXAM: CT THORACIC AND LUMBAR SPINE WITHOUT CONTRAST TECHNIQUE: Multidetector CT imaging of the thoracic and lumbar spine was performed without contrast. Multiplanar CT image reconstructions were also generated. COMPARISON:   Chest CT 06/08/2014 FINDINGS: CT THORACIC SPINE FINDINGS T1 and a portion of the T2 vertebra is excluded from view. Given distance from symptomatic level repeat imaging not performed. If this area is of clinical interest will gladly repeat and addend. There is no evidence of thoracic spine fracture or traumatic malalignment. Bridging osteophytes with ankylosis from T5-T12. No gross herniation or high-grade canal stenosis. There is an angular ground-glass opacity in the right upper lobe without evidence of progression since 2015, stability favoring scar. CT LUMBAR SPINE FINDINGS There is mild paravertebral fat edema around the L1-2 disc space anteriorly and to the right. An osteophyte on the right is fragmented at this level, chronicity indeterminate. There is prominent subchondral erosive changes along the anterior disc space at this level, and  early discitis is considered, although it would be unusual to be associated with vacuum phenomenon. There is subchondral erosive changes around the L3-4 disc space centrally, but no soft tissue inflammation. No posterior element fracture or subluxation. There is advanced diffuse degenerative disc and facet disease. No indication of high-grade canal stenosis or acute disc herniation. Foraminal narrowing greatest at L5-S1 bilaterally, mainly from disc narrowing and endplate spurs. Exophytic right renal cyst. IMPRESSION: CT THORACIC SPINE IMPRESSION 1. No acute finding. 2. T1 and T2 are excluded from view. CT LUMBAR SPINE IMPRESSION 1. L2 superior endplate osteophyte fracture or fragmentation, nondisplaced. Given endplate erosions around the subtle soft tissue edema at this level, please ensure no laboratory or clinical findings for discitis (presence of vacuum phenomenon makes this entity less likely by imaging). 2. L3-4 subchondral erosions without associated soft tissue inflammation. Electronically Signed   By: Marnee Spring M.D.   On: 01/22/2016 19:02   Mr Lumbar Spine  Wo Contrast  01/23/2016  CLINICAL DATA:  Initial evaluation for acute back pain, inability ambulate, right leg weakness. EXAM: MRI LUMBAR SPINE WITHOUT CONTRAST TECHNIQUE: Multiplanar, multisequence MR imaging of the lumbar spine was performed. No intravenous contrast was administered. COMPARISON:  Prior study from earlier same day FINDINGS: Dextroscoliosis of the thoracolumbar spine. Otherwise, vertebral bodies are normally aligned with preservation of the normal lumbar lordosis. No listhesis or malalignment. Abnormal oblique T1/T2 hypo intense signal intensity with associated edema extends through the right superior aspect of the L2 vertebral body, highly suspicious for an acute compression fracture. Minimal central height loss without bony retropulsion. There is edema within the adjacent L1-2 disc space, favored to be related to the fracture. Mild edema within the adjacent right paraspinous musculature (series 4, image 1). These changes are favored to be related to the fracture, although superimposed infection could also conceivably have this appearance. A degenerative Schmorl's node with associated edema present at the inferior endplate of L1. Additional possible subacute compression deformity through the superior endplate of L4 (series 4, image 9). Mild height loss without bony retropulsion. No other definite compression fracture. Signal intensity within the vertebral body bone marrow is markedly heterogeneous without definite discrete worrisome osseous lesion. Conus medullaris terminates normally at the L2 level. Signal intensity within the visualized cord is normal. Nerve roots of the cauda equina within normal limits. Fatty atrophy present within the paraspinous musculature. No other acute abnormality within the visualized paraspinous soft tissues. Few scattered cysts noted within the partially visualized kidneys. L1-2: Degenerative intervertebral disc space narrowing with disc bulge and disc desiccation.  Superimposed shallow right foraminal disc protrusion (series 5, image 5). No significant canal or foraminal stenosis. L2-3: Degenerative disc desiccation with intervertebral disc space narrowing and mild diffuse disc bulge. No focal disc herniation. Mild bile facet arthrosis ligamentum flavum hypertrophy. Resultant mild canal stenosis. Mild left foraminal narrowing. L3-4: Degenerative disc desiccation with disc bulge and intervertebral disc space narrowing. Bilateral facet arthrosis with ligamentum flavum hypertrophy. No focal disc herniation. Mild to moderate canal stenosis. Mild bilateral foraminal narrowing. L4-5: Degenerative disc desiccation with mild diffuse disc bulge. Bilateral facet arthrosis with ligamentum flavum thickening. Resultant mild to moderate canal stenosis. Mild right lateral recess narrowing. Foramina remain patent. L5-S1: Degenerative disc desiccation with intervertebral disc space narrowing and disc bulge. Superimposed left subarticular disc protrusion encroaching upon the lateral recess (series 6, image 35). Associated annular fissure. No significant canal stenosis. Superimposed mild facet arthrosis. Mild left foraminal narrowing. IMPRESSION: 1. Acute nondisplaced compression fracture involving the right  superior aspect of L2 without significant depression. Associated edema within the adjacent right paraspinous musculature and L1-2 intervertebral disc space may be related to fracture, although careful correlation with a laboratory values and clinical history is suggested as acute infection/discitis could also conceivably have this appearance. 2. Possible additional subacute compression fracture involving the superior aspect of L4 without significant height loss or bony retropulsion. 3. Multilevel degenerative spondylolysis as detailed above, most severe at L3-4 and L4-5 were there is mild to moderate canal narrowing. No significant foraminal stenosis within the lumbar spine. Electronically  Signed   By: Rise Mu M.D.   On: 01/23/2016 01:23    Microbiology: Recent Results (from the past 240 hour(s))  Culture, blood (routine x 2)     Status: None (Preliminary result)   Collection Time: 01/22/16 11:35 PM  Result Value Ref Range Status   Specimen Description BLOOD LEFT ANTECUBITAL  Final   Special Requests BOTTLES DRAWN AEROBIC AND ANAEROBIC 8CC  Final   Culture NO GROWTH 3 DAYS  Final   Report Status PENDING  Incomplete  Culture, blood (routine x 2)     Status: None (Preliminary result)   Collection Time: 01/22/16 11:45 PM  Result Value Ref Range Status   Specimen Description BLOOD RIGHT ANTECUBITAL  Final   Special Requests BOTTLES DRAWN AEROBIC AND ANAEROBIC 10CC  Final   Culture NO GROWTH 3 DAYS  Final   Report Status PENDING  Incomplete     Labs: Basic Metabolic Panel:  Recent Labs Lab 01/22/16 1711 01/23/16 0701  NA 133* 135  K 4.6 4.4  CL 100* 105  CO2 21* 22  GLUCOSE 120* 126*  BUN 25* 25*  CREATININE 1.76* 1.53*  CALCIUM 9.2 8.6*   CBC:  Recent Labs Lab 01/22/16 1711 01/23/16 0701  WBC 12.6* 8.5  HGB 13.2 11.7*  HCT 39.0 35.3*  MCV 88.4 88.9  PLT 351 313   CBG:  Recent Labs Lab 01/22/16 2125  GLUCAP 103*    Signed:  Blaine Guiffre  [facility] 01/26/2016, 8:00 PM

## 2016-01-26 NOTE — Progress Notes (Signed)
Orthopedic Tech Progress Note Patient Details:  Travis JacksonCharles E Redler 12-Nov-1914 [id number]  Ortho Devices Type of Ortho Device: Wrist splint Ortho Device/Splint Interventions: Application   Saul FordyceJennifer C Latessa Tillis 01/26/2016, 4:02 PM

## 2016-01-27 ENCOUNTER — Other Ambulatory Visit: Payer: Self-pay | Admitting: General Surgery

## 2016-01-27 DIAGNOSIS — IMO0002 Reserved for concepts with insufficient information to code with codable children: Secondary | ICD-10-CM

## 2016-01-28 LAB — CULTURE, BLOOD (ROUTINE X 2)
CULTURE: NO GROWTH
Culture: NO GROWTH

## 2016-01-29 ENCOUNTER — Telehealth: Payer: Self-pay | Admitting: *Deleted

## 2016-01-29 NOTE — Telephone Encounter (Signed)
I called patient son to schedule for Kypho consult and son states not happening. They have hospice at their home dad is not doing good at all./vm

## 2016-02-23 DEATH — deceased

## 2016-11-19 IMAGING — MR MR LUMBAR SPINE W/O CM
4 of 5 series · 17 of 48 positions shown · non-contrast
Comparison: Prior study from earlier same day

CLINICAL DATA: Initial evaluation for acute back pain, inability
ambulate, right leg weakness.

EXAM:
MRI LUMBAR SPINE WITHOUT CONTRAST
TECHNIQUE: Multiplanar, multisequence MR imaging of the lumbar spine was
performed. No intravenous contrast was administered.

[Series 3: T2 · sagittal · 4.0mm · 0.55mm/px · 6 of 16 slices shown (1 of 2)]
[im 1/16]
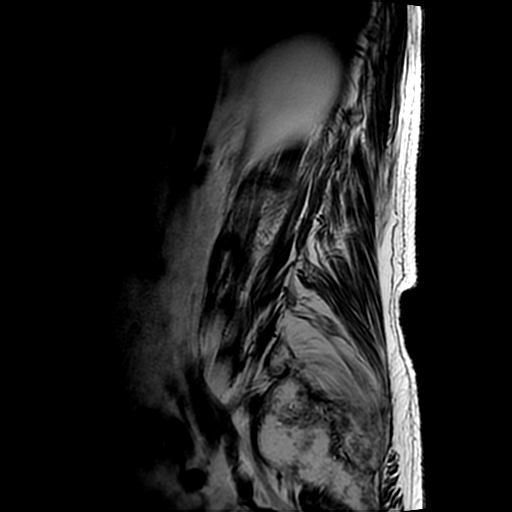
[im 4/16]
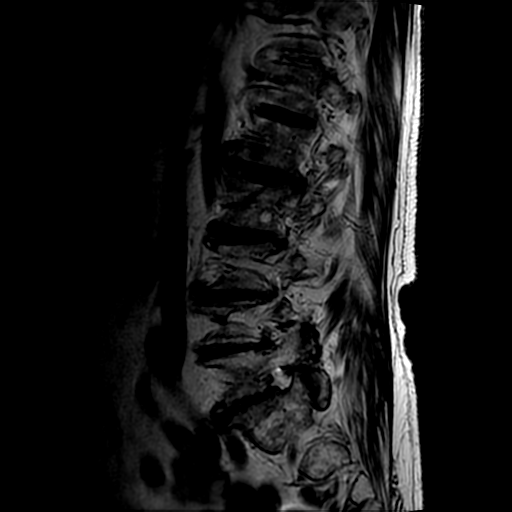
[im 7/16]
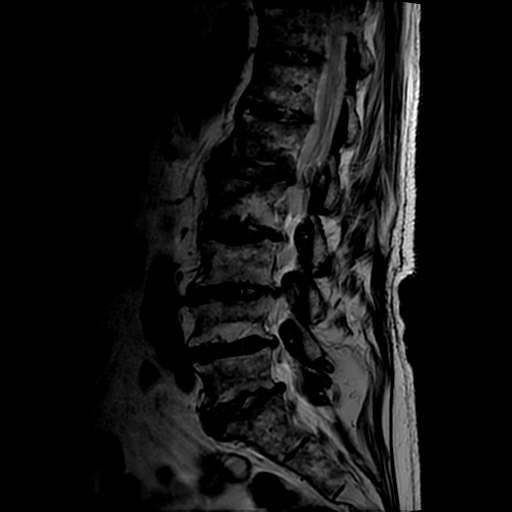
[im 10/16]
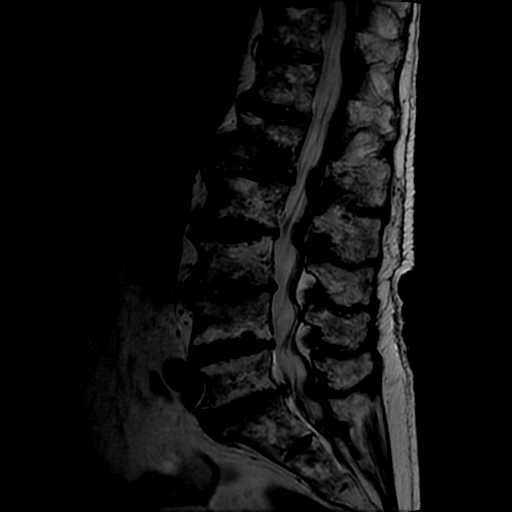
[im 13/16]
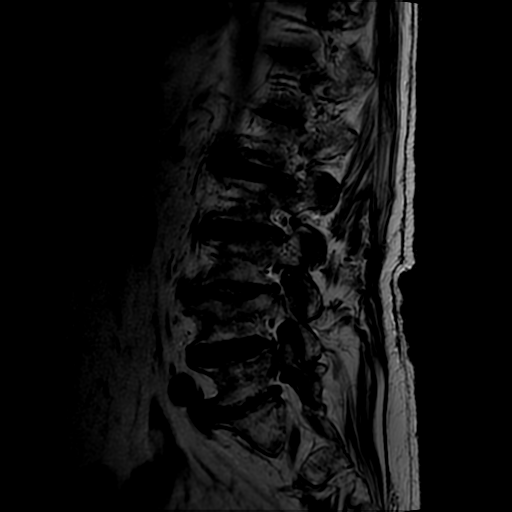
[im 16/16]
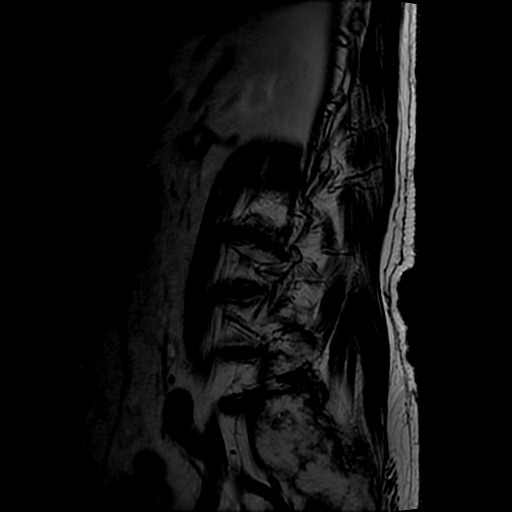

[Series 5: T1 · sagittal · 4.0mm · 0.55mm/px · 3 of 16 slices shown (1 of 2)]
[im 4/16]
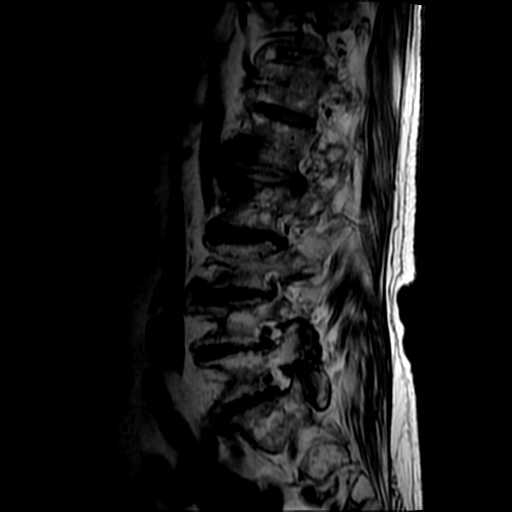
[im 10/16]
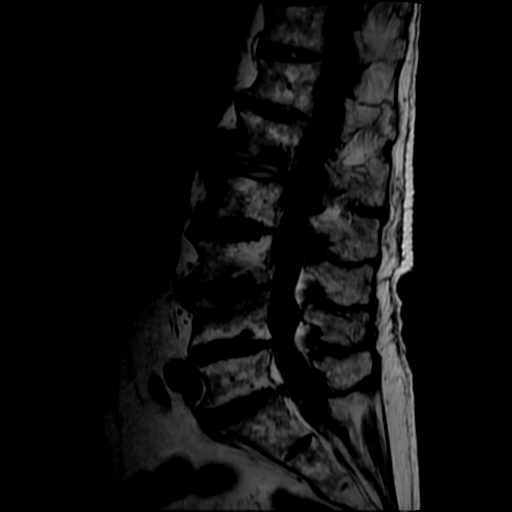
[im 16/16]
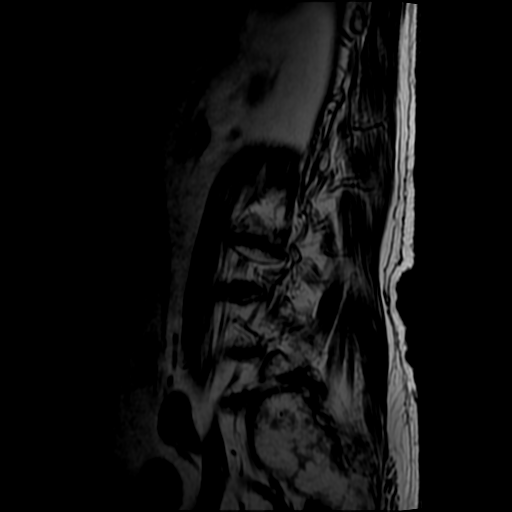

[Series 6: T2 · axial · 4.0mm · 0.39mm/px · z∈[-5,+160]mm · 5 of 40 slices shown (2 of 2)]
[im 1/40]
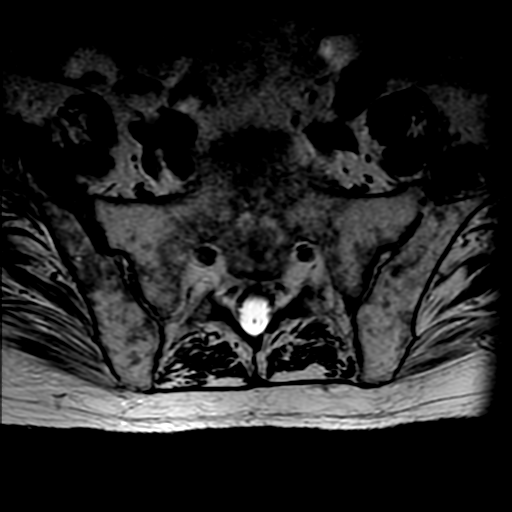
[im 6/40]
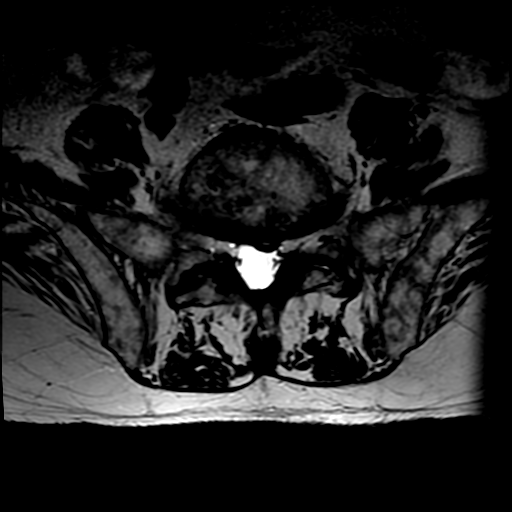
[im 12/40]
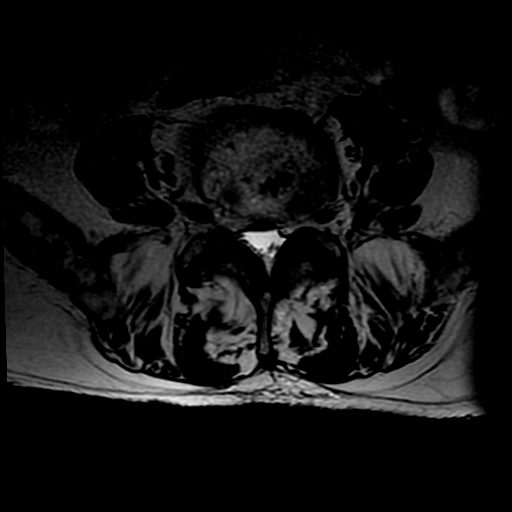
[im 20/40]
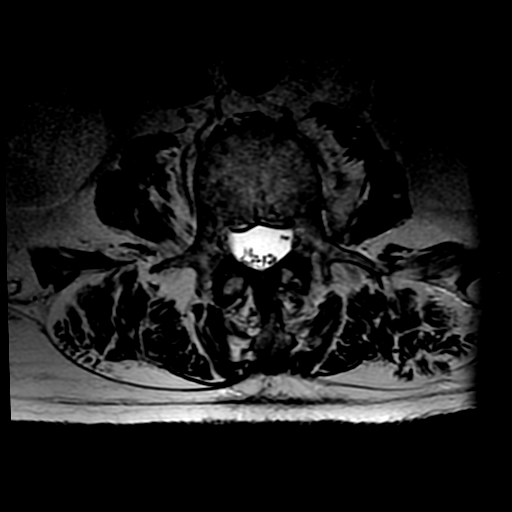
[im 34/40]
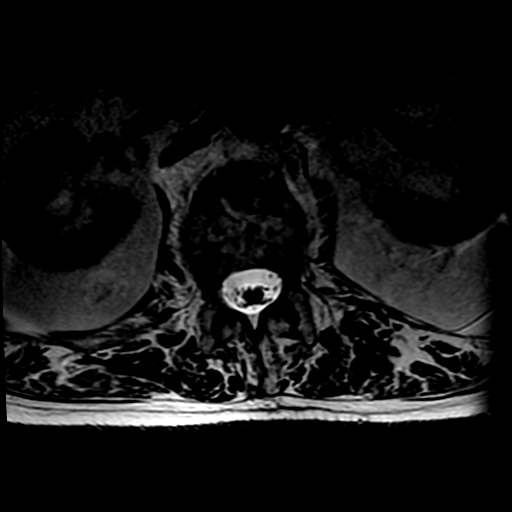

[Series 7: T1 · axial · 4.0mm · 0.39mm/px · z∈[+20,+160]mm · 3 of 40 slices shown (2 of 2)]
[im 6/40]
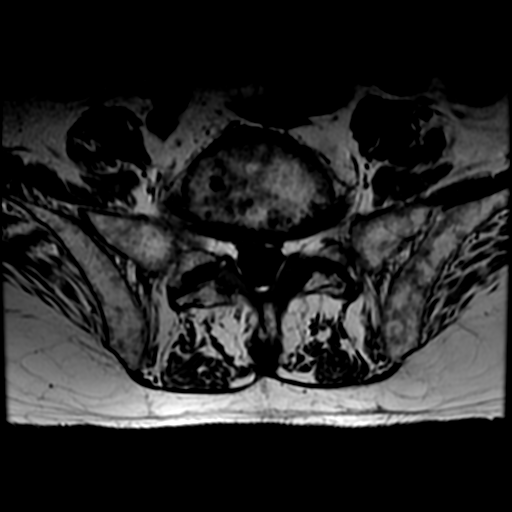
[im 20/40]
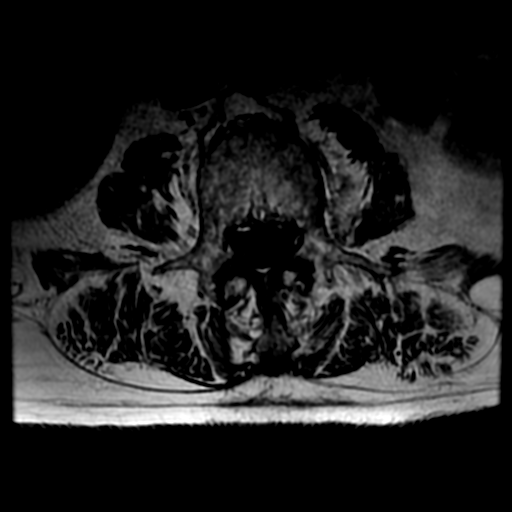
[im 34/40]
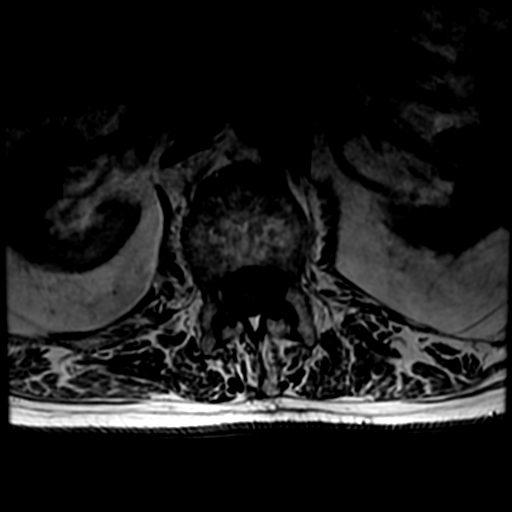

[17 of 48 positions shown; findings below may reference images not displayed]

FINDINGS: Dextroscoliosis of the thoracolumbar spine. Otherwise, vertebral
bodies are normally aligned with preservation of the normal lumbar
lordosis. No listhesis or malalignment.

Abnormal oblique T1/T2 hypo intense signal intensity with associated
edema extends through the right superior aspect of the L2 vertebral
body, highly suspicious for an acute compression fracture. Minimal
central height loss without bony retropulsion. There is edema within
the adjacent L1-2 disc space, favored to be related to the fracture.
Mild edema within the adjacent right paraspinous musculature (series
4, image 1). These changes are favored to be related to the
fracture, although superimposed infection could also conceivably
have this appearance. A degenerative Schmorl's node with associated
edema present at the inferior endplate of L1.

Additional possible subacute compression deformity through the
superior endplate of L4 (series 4, image 9). Mild height loss
without bony retropulsion.

No other definite compression fracture. Signal intensity within the
vertebral body bone marrow is markedly heterogeneous without
definite discrete worrisome osseous lesion.

Conus medullaris terminates normally at the L2 level. Signal
intensity within the visualized cord is normal. Nerve roots of the
cauda equina within normal limits.

Fatty atrophy present within the paraspinous musculature. No other
acute abnormality within the visualized paraspinous soft tissues.
Few scattered cysts noted within the partially visualized kidneys.

L1-2: Degenerative intervertebral disc space narrowing with disc
bulge and disc desiccation. Superimposed shallow right foraminal
disc protrusion (series 5, image 5). No significant canal or
foraminal stenosis.

L2-3: Degenerative disc desiccation with intervertebral disc space
narrowing and mild diffuse disc bulge. No focal disc herniation.
Mild bile facet arthrosis ligamentum flavum hypertrophy. Resultant
mild canal stenosis. Mild left foraminal narrowing.

L3-4: Degenerative disc desiccation with disc bulge and
intervertebral disc space narrowing. Bilateral facet arthrosis with
ligamentum flavum hypertrophy. No focal disc herniation. Mild to
moderate canal stenosis. Mild bilateral foraminal narrowing.

L4-5: Degenerative disc desiccation with mild diffuse disc bulge.
Bilateral facet arthrosis with ligamentum flavum thickening.
Resultant mild to moderate canal stenosis. Mild right lateral recess
narrowing. Foramina remain patent.

L5-S1: Degenerative disc desiccation with intervertebral disc space
narrowing and disc bulge. Superimposed left subarticular disc
protrusion encroaching upon the lateral recess (series 6, image 35).
Associated annular fissure. No significant canal stenosis.
Superimposed mild facet arthrosis. Mild left foraminal narrowing.
IMPRESSION: 1. Acute nondisplaced compression fracture involving the right
superior aspect of L2 without significant depression. Associated
edema within the adjacent right paraspinous musculature and L1-2
intervertebral disc space may be related to fracture, although
careful correlation with a laboratory values and clinical history is
suggested as acute infection/discitis could also conceivably have
this appearance.
2. Possible additional subacute compression fracture involving the
superior aspect of L4 without significant height loss or bony
retropulsion.
3. Multilevel degenerative spondylolysis as detailed above, most
severe at L3-4 and L4-5 were there is mild to moderate canal
narrowing. No significant foraminal stenosis within the lumbar
spine.
